# Patient Record
Sex: Male | Born: 1955 | Race: White | Hispanic: No | Marital: Married
Health system: Midwestern US, Community
[De-identification: ages and names within clinical notes are randomized; demographics above are authoritative.]

## PROBLEM LIST (undated history)

## (undated) DIAGNOSIS — R509 Fever, unspecified: Secondary | ICD-10-CM

## (undated) DIAGNOSIS — E119 Type 2 diabetes mellitus without complications: Secondary | ICD-10-CM

## (undated) DIAGNOSIS — R0609 Other forms of dyspnea: Secondary | ICD-10-CM

## (undated) DIAGNOSIS — E785 Hyperlipidemia, unspecified: Secondary | ICD-10-CM

## (undated) DIAGNOSIS — R0600 Dyspnea, unspecified: Secondary | ICD-10-CM

## (undated) DIAGNOSIS — F17200 Nicotine dependence, unspecified, uncomplicated: Secondary | ICD-10-CM

## (undated) DIAGNOSIS — Z8546 Personal history of malignant neoplasm of prostate: Secondary | ICD-10-CM

## (undated) DIAGNOSIS — K5792 Diverticulitis of intestine, part unspecified, without perforation or abscess without bleeding: Secondary | ICD-10-CM

## (undated) DIAGNOSIS — I8390 Asymptomatic varicose veins of unspecified lower extremity: Secondary | ICD-10-CM

## (undated) HISTORY — DX: Nicotine dependence, unspecified, uncomplicated: F17.200

## (undated) HISTORY — DX: Other forms of dyspnea: R06.09

## (undated) HISTORY — DX: Type 2 diabetes mellitus without complications: E11.9

## (undated) HISTORY — DX: Personal history of malignant neoplasm of prostate: Z85.46

## (undated) HISTORY — DX: Diverticulitis of intestine, part unspecified, without perforation or abscess without bleeding: K57.92

## (undated) HISTORY — DX: Dyspnea, unspecified: R06.00

## (undated) HISTORY — DX: Hyperlipidemia, unspecified: E78.5

## (undated) HISTORY — DX: Asymptomatic varicose veins of unspecified lower extremity: I83.90

---

## 1997-04-26 ENCOUNTER — Ambulatory Visit (HOSPITAL_COMMUNITY): Admission: RE | Admit: 1997-04-26 | Discharge: 1997-04-26 | Payer: Self-pay | Admitting: Family Medicine

## 1997-06-24 ENCOUNTER — Ambulatory Visit (HOSPITAL_COMMUNITY): Admission: RE | Admit: 1997-06-24 | Discharge: 1997-06-24 | Payer: Self-pay | Admitting: Gastroenterology

## 1999-03-17 ENCOUNTER — Encounter: Admission: RE | Admit: 1999-03-17 | Discharge: 1999-04-01 | Payer: Self-pay | Admitting: Family Medicine

## 2003-07-12 ENCOUNTER — Ambulatory Visit (HOSPITAL_BASED_OUTPATIENT_CLINIC_OR_DEPARTMENT_OTHER): Admission: RE | Admit: 2003-07-12 | Discharge: 2003-07-12 | Payer: Self-pay | Admitting: Family Medicine

## 2005-05-20 ENCOUNTER — Inpatient Hospital Stay (HOSPITAL_COMMUNITY): Admission: RE | Admit: 2005-05-20 | Discharge: 2005-05-21 | Payer: Self-pay | Admitting: Urology

## 2019-10-25 NOTE — Progress Notes (Signed)
CARDIOLOGY CONSULT NOTE       Patient ID: Ryan Hill MRN: [id number] DOB/AGE: 1956-02-09 64 y.o.  Admit date: (Not on file) Referring Physician: Jacelyn Grip Primary Physician: Vernie Shanks, MD Primary Cardiologist: New Reason for Consultation: Dyspnea  Active Problems:   * No active hospital problems. *   HPI:  64 y.o. referred by Dr Jacelyn Grip for dyspnea  He is smoker with HLD and DM.  Latter poorly controlled with recent A1c 10.8 labs otherwise ok CXR 08/17/19 NAD, no CE.  Has had COVID vaccine ECG normal sinus rate 59 normal ECG at his office and today in ours rate 67 In talking to the patient and his wife he has not taken very good care of himself. DM not controlled Triglycerides over 500 , smoked up until a couple months ago. Currently only on glucophage. Has wine most nights. Long discussion about need to cut back on ETOH, get BS under control and likely start cholesterol medicine. I suspect his general malaise and lack of stamina is related to these medical issues Denies actual chest pain, palpitations or dyspnea. Had vascular studies done recently with no PVD and no AAA  He and his wife live in RV Currently in [place] mostly but son lives here Use to sail quit a bit and met his wife Retrieving an old anchor in [place] after he was stranded   ROS All other systems reviewed and negative except as noted above  Past Medical History:  Diagnosis Date  . Asymptomatic varicose veins   . Diverticulitis   . DM (diabetes mellitus) ([place])   . Exertional dyspnea   . History of prostate cancer   . Hyperlipidemia   . Smoker     Family History  Problem Relation Age of Onset  . Cancer Father   . Cancer Brother   . Cancer Brother     Social History   Socioeconomic History  . Marital status: Married    Spouse name: Not on file  . Number of children: Not on file  . Years of education: Not on file  . Highest education level: Not on file  Occupational History  . Not on file  Tobacco Use   . Smoking status: Former Smoker    Types: Cigars    Quit date: 08/17/2019    Years since quitting: 0.2  Substance and Sexual Activity  . Alcohol use: Not on file  . Drug use: Not on file  . Sexual activity: Not on file  Other Topics Concern  . Not on file  Social History Narrative  . Not on file   Social Determinants of Health   Financial Resource Strain:   . Difficulty of Paying Living Expenses: Not on file  Food Insecurity:   . Worried About Charity fundraiser in the Last Year: Not on file  . Ran Out of Food in the Last Year: Not on file  Transportation Needs:   . Lack of Transportation (Medical): Not on file  . Lack of Transportation (Non-Medical): Not on file  Physical Activity:   . Days of Exercise per Week: Not on file  . Minutes of Exercise per Session: Not on file  Stress:   . Feeling of Stress : Not on file  Social Connections:   . Frequency of Communication with Friends and Family: Not on file  . Frequency of Social Gatherings with Friends and Family: Not on file  . Attends Religious Services: Not on file  . Active Member of Clubs  or Organizations: Not on file  . Attends Archivist Meetings: Not on file  . Marital Status: Not on file  Intimate Partner Violence:   . Fear of Current or Ex-Partner: Not on file  . Emotionally Abused: Not on file  . Physically Abused: Not on file  . Sexually Abused: Not on file      Current Outpatient Medications:  .  metFORMIN (GLUCOPHAGE) 500 MG tablet, Take 500 mg by mouth 2 (two) times daily with a meal., Disp: , Rfl:  .  metoprolol tartrate (LOPRESSOR) 50 MG tablet, Take one tablet by mouth two hours prior to CT, Disp: 1 tablet, Rfl: 0    Physical Exam: Blood pressure 98/62, pulse 67, height $RemoveBe'5\' 10"'RtZzruURB$  (1.778 m), weight 194 lb (88 kg), SpO2 98 %.   Affect appropriate Healthy:  appears stated age 45: normal Neck supple with no adenopathy JVP normal no bruits no thyromegaly Lungs clear with no wheezing and  good diaphragmatic motion Heart:  S1/S2 no murmur, no rub, gallop or click PMI normal Abdomen: benighn, BS positve, no tenderness, no AAA no bruit.  No HSM or HJR Distal pulses intact with no bruits No edema Neuro non-focal Skin warm and dry No muscular weakness     Radiology: No results found.  EKG: Julsy 2021 primary SR rate 59 normal ECG    ASSESSMENT AND PLAN:   1. Dyspnea :  Normal exam and ECG check echo for RV/LV EF 2. DM:  Discussed low carb diet.  Target hemoglobin A1c is 6.5 or less.  Continue current medications. Encouraged him to see eye doctor  3. HLD:  F.u primary und able to calculate LdL due to high triglycerides Suspect he should be on statin and vascepa  Will know more once A1c down , ETOH cut back and we have calcium score from cardiac CT 4. CAD:  See #1 r/o anginal equivalent with risk factors needs testing Favor cardiac CTA discussed and patient willing to proceed. Will have lopressor 50 mg 2 hours before study and BMET prior to exam   Signed: Jenkins Rouge 11/07/2019, 4:34 PM

## 2019-11-07 ENCOUNTER — Ambulatory Visit (INDEPENDENT_AMBULATORY_CARE_PROVIDER_SITE_OTHER): Payer: 59 | Admitting: Cardiovascular Disease

## 2019-11-07 ENCOUNTER — Other Ambulatory Visit: Payer: Self-pay

## 2019-11-07 ENCOUNTER — Encounter: Payer: Self-pay | Admitting: Cardiovascular Disease

## 2019-11-07 VITALS — BP 98/62 | HR 67 | Ht 70.0 in | Wt 194.0 lb

## 2019-11-07 DIAGNOSIS — R0600 Dyspnea, unspecified: Secondary | ICD-10-CM

## 2019-11-07 MED ORDER — METOPROLOL TARTRATE 50 MG PO TABS: ORAL_TABLET | ORAL | 0 refills | Status: DC

## 2019-11-07 NOTE — Patient Instructions (Addendum)
Medication Instructions:  *If you need a refill on your cardiac medications before your next appointment, please call your pharmacy*  Lab Work: Your physician recommends that you return for lab work in: before [place] for [employer].  If you have labs (blood work) drawn today and your tests are completely normal, you will receive your results only by: Marland Kitchen MyChart Message (if you have MyChart) OR . A paper copy in the mail If you have any lab test that is abnormal or we need to change your treatment, we will call you to review the results.  Testing/Procedures: Your physician has requested that you have an echocardiogram. Echocardiography is a painless test that uses sound waves to create images of your heart. It provides your doctor with information about the size and shape of your heart and how well your heart's chambers and valves are working. This procedure takes approximately one hour. There are no restrictions for this procedure.  Your physician has requested that you have cardiac CT. Cardiac computed tomography (CT) is a painless test that uses an x-ray machine to take clear, detailed pictures of your heart. For further information please visit [email or web address]. Please follow instruction sheet as given.  Follow-Up: At [facility], you and your health needs are our priority.  As part of our continuing mission to provide you with exceptional heart care, we have created designated Provider Care Teams.  These Care Teams include your primary Cardiologist (physician) and Advanced Practice Providers (APPs -  Physician Assistants and Nurse Practitioners) who all work together to provide you with the care you need, when you need it.  We recommend signing up for the patient portal called "MyChart".  Sign up information is provided on this After Visit Summary.  MyChart is used to connect with patients for Virtual Visits (Telemedicine).  Patients are able to view lab/test results, encounter notes, upcoming  appointments, etc.  Non-urgent messages can be sent to your provider as well.   To learn more about what you can do with MyChart, go to [email or web address].    Your next appointment:   6 month(s)  The format for your next appointment:   In Person  Provider:   You may see Dr. Johnsie Cancel or one of the following Advanced Practice Providers on your designated Care Team:    Truitt Merle, NP  Cecilie Kicks, NP  Kathyrn Drown, NP    Your cardiac CT will be scheduled at one of the below locations:   [facility] [address] [place], [place] [address] [phone]  If scheduled at [facility], please arrive at the [facility] main entrance of [facility] 30 minutes prior to test start time. Proceed to the [facility] Radiology Department (first floor) to check-in and test prep.  Please follow these instructions carefully (unless otherwise directed):  Hold all erectile dysfunction medications at least 3 days (72 hrs) prior to test.  On the Night Before the Test: . Be sure to Drink plenty of water. . Do not consume any caffeinated/decaffeinated beverages or chocolate 12 hours prior to your test. . Do not take any antihistamines 12 hours prior to your test.  On the Day of the Test: . Drink plenty of water. Do not drink any water within one hour of the test. . Do not eat any food 4 hours prior to the test. . You may take your regular medications prior to the test.  . Take metoprolol (Lopressor) 50 mg two hours prior to test.  After the Test: . Drink plenty of water. . After receiving IV contrast, you may experience a mild flushed feeling. This is normal. . On occasion, you may experience a mild rash up to 24 hours after the test. This is not dangerous. If this occurs, you can take Benadryl 25 mg and increase your fluid intake. . If you experience trouble breathing, this can be serious. If it is severe call 911 IMMEDIATELY. If it is mild, please  call our office. . If you take any of these medications: Metformin  please do not take 48 hours after completing test unless otherwise instructed.   Once we have confirmed authorization from your insurance company, we will call you to set up a date and time for your test. Based on how quickly your insurance processes prior authorizations requests, please allow up to 4 weeks to be contacted for scheduling your Cardiac CT appointment. Be advised that routine Cardiac CT appointments could be scheduled as many as 8 weeks after your provider has ordered it.  For non-scheduling related questions, please contact the cardiac imaging nurse navigator should you have any questions/concerns: Marchia Bond, Cardiac Imaging Nurse Navigator Burley Saver, Interim Cardiac Imaging Nurse [place] [facility] Direct Office Dial: [phone]   For scheduling needs, including cancellations and rescheduling, please call Vivien Rota at [phone], option 3.

## 2019-11-08 ENCOUNTER — Other Ambulatory Visit: Payer: Self-pay | Admitting: Cardiovascular Disease

## 2019-11-15 ENCOUNTER — Other Ambulatory Visit: Payer: Self-pay

## 2019-11-15 ENCOUNTER — Ambulatory Visit (HOSPITAL_COMMUNITY): Payer: 59 | Attending: Internal Medicine

## 2019-11-15 DIAGNOSIS — R0600 Dyspnea, unspecified: Secondary | ICD-10-CM

## 2019-11-15 LAB — ECHOCARDIOGRAM COMPLETE
Area-P 1/2: 3.39 cm2
S' Lateral: 3.7 cm

## 2019-11-30 ENCOUNTER — Telehealth: Payer: Self-pay

## 2019-11-30 DIAGNOSIS — R0600 Dyspnea, unspecified: Secondary | ICD-10-CM

## 2019-11-30 NOTE — Telephone Encounter (Signed)
Left message for patient to call back.  Insurance will not cover cardiac CT. Per Dr. Eden Emms can order exercise myoview.

## 2019-12-12 ENCOUNTER — Telehealth (HOSPITAL_COMMUNITY): Payer: Self-pay | Admitting: *Deleted

## 2019-12-12 NOTE — Telephone Encounter (Signed)
Patient given detailed instructions per Myocardial Perfusion Study Information Sheet for the test on 12/19/19. Patient notified to arrive 15 minutes early and that it is imperative to arrive on time for appointment to keep from having the test rescheduled.  If you need to cancel or reschedule your appointment, please call the office within 24 hours of your appointment. . Patient verbalized understanding. Ryan Hill    

## 2019-12-15 ENCOUNTER — Other Ambulatory Visit (HOSPITAL_COMMUNITY)
Admission: RE | Admit: 2019-12-15 | Discharge: 2019-12-15 | Disposition: A | Discharge status: Home / Self Care | Payer: 59 | Source: Ambulatory Visit | Attending: Cardiovascular Disease | Admitting: Cardiovascular Disease

## 2019-12-15 DIAGNOSIS — Z01812 Encounter for preprocedural laboratory examination: Secondary | ICD-10-CM | POA: Insufficient documentation

## 2019-12-15 DIAGNOSIS — Z20822 Contact with and (suspected) exposure to covid-19: Secondary | ICD-10-CM | POA: Insufficient documentation

## 2019-12-15 LAB — SARS CORONAVIRUS 2 (TAT 6-24 HRS): SARS Coronavirus 2: NEGATIVE

## 2019-12-19 ENCOUNTER — Other Ambulatory Visit: Payer: Self-pay

## 2019-12-19 ENCOUNTER — Ambulatory Visit (HOSPITAL_COMMUNITY): Payer: 59 | Attending: Internal Medicine

## 2019-12-19 ENCOUNTER — Telehealth: Payer: Self-pay

## 2019-12-19 DIAGNOSIS — R9439 Abnormal result of other cardiovascular function study: Secondary | ICD-10-CM

## 2019-12-19 DIAGNOSIS — R0600 Dyspnea, unspecified: Secondary | ICD-10-CM | POA: Insufficient documentation

## 2019-12-19 DIAGNOSIS — R072 Precordial pain: Secondary | ICD-10-CM

## 2019-12-19 LAB — MYOCARDIAL PERFUSION IMAGING
Estimated workload: 10.1 METS
Exercise duration (min): 9 min
Exercise duration (sec): 1 s
LV dias vol: 112 mL (ref 62–150)
LV sys vol: 61 mL
MPHR: 156 {beats}/min
Peak HR: 136 {beats}/min
Percent HR: 87 %
Rest HR: 63 {beats}/min
SDS: 2
SRS: 0
SSS: 2
TID: 0.99

## 2019-12-19 MED ORDER — TECHNETIUM TC 99M TETROFOSMIN IV KIT
10.7000 | PACK | Freq: Once | INTRAVENOUS | Status: AC | PRN
  Administered 2019-12-19: 10.7 via INTRAVENOUS
  Filled 2019-12-19: qty 11

## 2019-12-19 MED ORDER — TECHNETIUM TC 99M TETROFOSMIN IV KIT
30.4000 | PACK | Freq: Once | INTRAVENOUS | Status: AC | PRN
  Administered 2019-12-19: 30.4 via INTRAVENOUS
  Filled 2019-12-19: qty 31

## 2019-12-19 NOTE — Telephone Encounter (Signed)
Ordered cardiac CT. Patient aware of results. Will send message to mychart of instructions.

## 2019-12-19 NOTE — Telephone Encounter (Signed)
-----   Message from Wendall Stade, MD sent at 12/19/2019  2:45 PM EST ----- Some but minor abnormalities on myovue ECG positive perfusion probably ok but inferior attenuation ? Diaphragm. EF reported as 46% but normal by echo See if we can get cardiac CT covered now for abnormal myovue. If not may need cath can schedule f/u with me .

## 2020-01-16 ENCOUNTER — Other Ambulatory Visit: Payer: Self-pay

## 2020-01-16 ENCOUNTER — Other Ambulatory Visit: Payer: 59 | Admitting: *Deleted

## 2020-01-16 DIAGNOSIS — R072 Precordial pain: Secondary | ICD-10-CM

## 2020-01-16 DIAGNOSIS — R9439 Abnormal result of other cardiovascular function study: Secondary | ICD-10-CM

## 2020-01-16 NOTE — Addendum Note (Signed)
Addended by: Virl Axe, Sadiya Durand L on: 01/16/2020 12:58 PM   Modules accepted: Orders

## 2020-01-17 LAB — BASIC METABOLIC PANEL
BUN/Creatinine Ratio: 20 (ref 10–24)
BUN: 15 mg/dL (ref 8–27)
CO2: 26 mmol/L (ref 20–29)
Calcium: 9.2 mg/dL (ref 8.6–10.2)
Chloride: 100 mmol/L (ref 96–106)
Creatinine, Ser: 0.74 mg/dL — ABNORMAL LOW (ref 0.76–1.27)
GFR calc Af Amer: 113 mL/min/{1.73_m2} (ref >59)
GFR calc non Af Amer: 97 mL/min/{1.73_m2} (ref >59)
Glucose: 195 mg/dL — ABNORMAL HIGH (ref 65–99)
Potassium: 4.6 mmol/L (ref 3.5–5.2)
Sodium: 140 mmol/L (ref 134–144)

## 2020-01-28 ENCOUNTER — Telehealth (HOSPITAL_COMMUNITY): Payer: Self-pay | Admitting: Emergency Medicine

## 2020-01-28 NOTE — Telephone Encounter (Signed)
Attempted to call patient regarding upcoming cardiac CT appointment. °Left message on voicemail with name and callback number °Zehra Rucci RN Navigator Cardiac Imaging °[facility] °[phone] Office °[phone] Cell ° °

## 2020-01-30 ENCOUNTER — Encounter (HOSPITAL_COMMUNITY): Payer: Self-pay

## 2020-01-30 ENCOUNTER — Ambulatory Visit (HOSPITAL_COMMUNITY)
Admission: RE | Admit: 2020-01-30 | Discharge: 2020-01-30 | Disposition: A | Discharge status: Home / Self Care | Payer: 59 | Source: Ambulatory Visit | Attending: Cardiovascular Disease | Admitting: Cardiovascular Disease

## 2020-01-30 ENCOUNTER — Other Ambulatory Visit: Payer: Self-pay

## 2020-01-30 DIAGNOSIS — R9439 Abnormal result of other cardiovascular function study: Secondary | ICD-10-CM | POA: Insufficient documentation

## 2020-01-30 DIAGNOSIS — R072 Precordial pain: Secondary | ICD-10-CM | POA: Diagnosis present

## 2020-01-30 DIAGNOSIS — I2510 Atherosclerotic heart disease of native coronary artery without angina pectoris: Secondary | ICD-10-CM | POA: Diagnosis not present

## 2020-01-30 DIAGNOSIS — Z006 Encounter for examination for normal comparison and control in clinical research program: Secondary | ICD-10-CM

## 2020-01-30 IMAGING — CT CT HEART MORP W/ CTA COR W/ SCORE W/ CA W/CM &/OR W/O CM
4 of 7 series · 8 of 20 positions shown, 9 images · non-contrast
Comparison: 05/17/2005 chest radiograph. 08/17/2019 chest
radiograph from [facility].
COMPARISON: 05/17/2005 chest radiograph. 08/17/2019 chest
radiograph from [facility].

Addendum:
EXAM:
OVER-READ INTERPRETATION  CT CHEST

The following report is an over-read performed by radiologist Dr.
over-read does not include interpretation of cardiac or coronary
anatomy or pathology. The coronary CTA interpretation by the
cardiologist is attached.
CLINICAL DATA: Chest pain
Cardiac CTA
MEDICATIONS:
Sub lingual nitro. 4 mg and lopressor 50mg
TECHNIQUE: The patient was scanned on a Siemens Force 192 scanner. Gantry
rotation speed was 250 msecs. Collimation was. 6 mm . A 120 kV
prospective scan was triggered in the ascending thoracic aorta at
140 HU's with full mA between 30-70% of the R-R interval . Average
HR during the scan was 54 bpm. The 3D data set was interpreted on a
dedicated work station using MPR, MIP and VRT modes. A total of 80
cc of contrast was used.

[Series 6: best diast 75 % · axial · 0.35mm/px · z∈[+1146,+1189]mm · 2 of 326 slices shown]
[im 109/326  vessel]
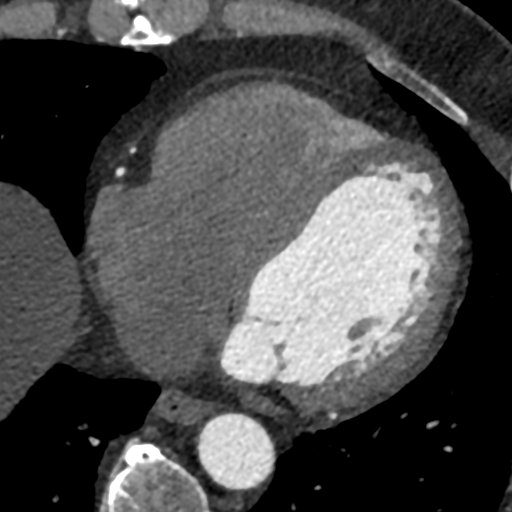
[im 217/326  vessel]
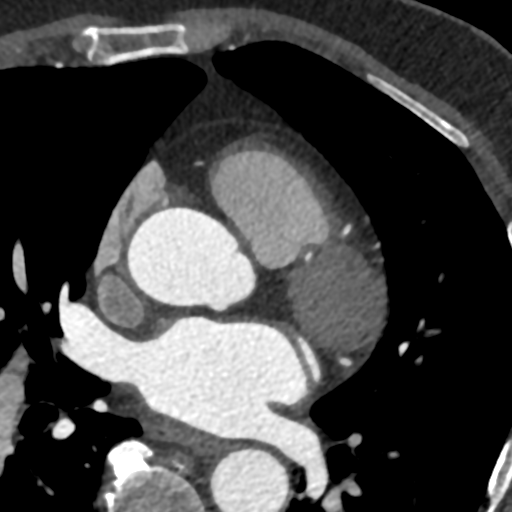

[Series 7: best syst · axial · 0.35mm/px · z∈[+1146,+1189]mm · 2 of 326 slices shown, 3 images]
[im 109/326  vessel]
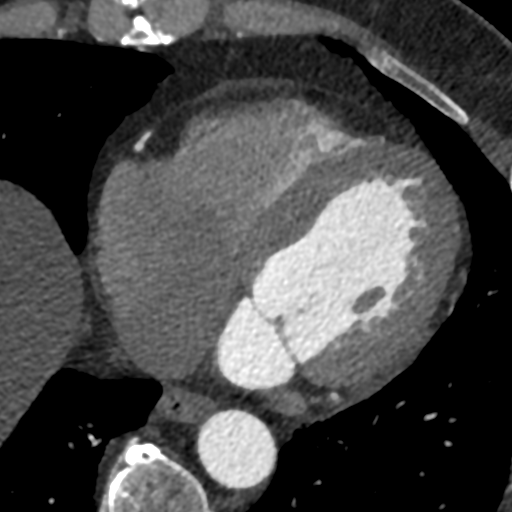
[im 109/326  lung]
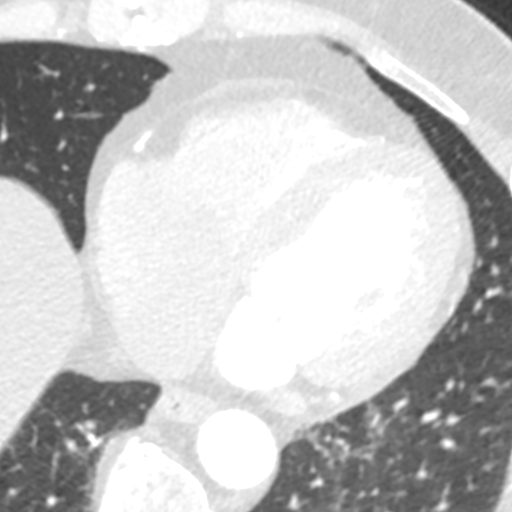
[im 217/326  vessel]
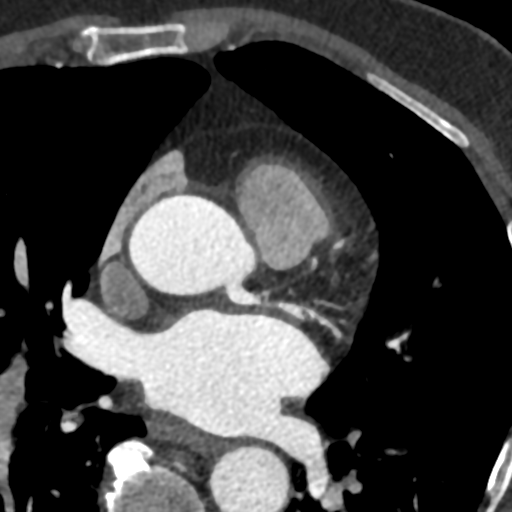

[Series 8: ts diast sharp 75 % · axial · 0.35mm/px · z∈[+1146,+1189]mm · 2 of 326 slices shown]
[im 109/326  lung]
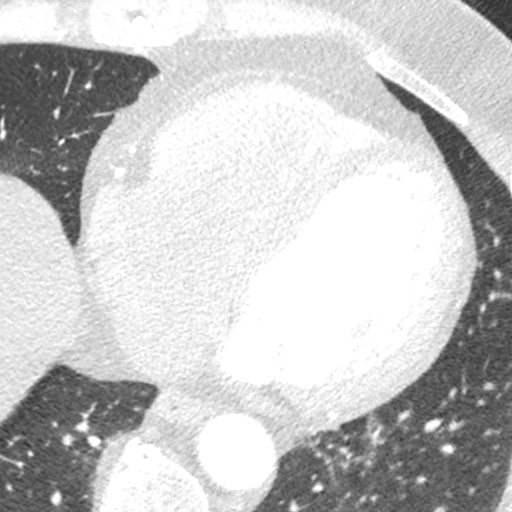
[im 217/326  lung]
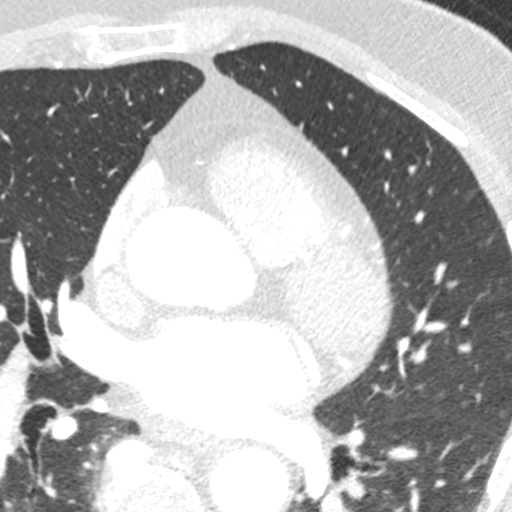

[Series 9: ts syst sharp · axial · 0.35mm/px · z∈[+1146,+1189]mm · 2 of 326 slices shown]
[im 109/326  lung]
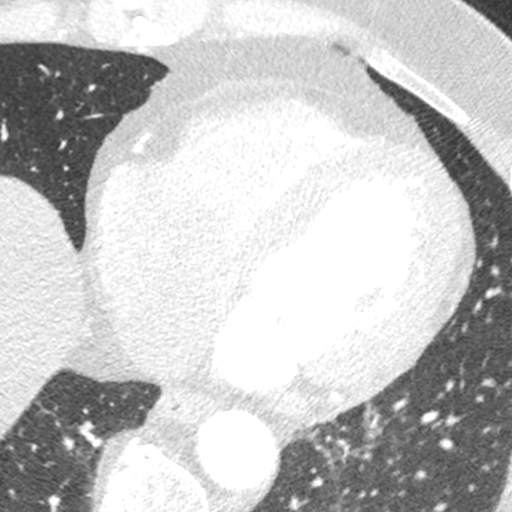
[im 217/326  lung]
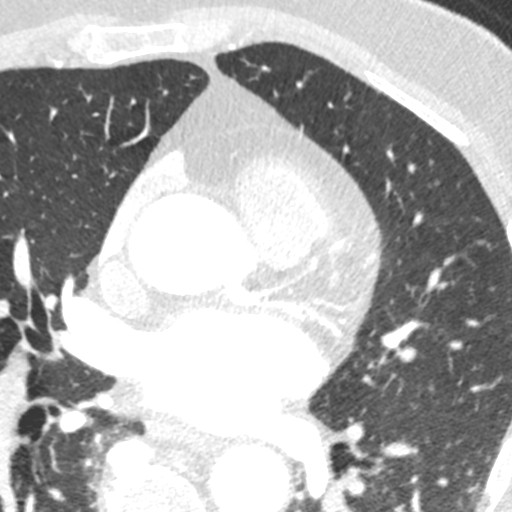

[8 of 20 positions shown; findings below may reference images not displayed]

FINDINGS: Vascular: Aortic atherosclerosis. No central pulmonary embolism, on
this non-dedicated study.

Mediastinum/Nodes: No imaged thoracic adenopathy.

Lungs/Pleura: No pleural fluid.  Clear imaged lungs.

Upper Abdomen: Hepatic steatosis. Normal imaged portions of the
spleen, stomach.

Musculoskeletal: Moderate midthoracic spondylosis.
IMPRESSION: 1. No acute findings in the imaged extracardiac chest.
2. Hepatic steatosis.
3. Aortic Atherosclerosis (4T26T-AQ7.7).
FINDINGS: Non-cardiac: See separate report from [REDACTED]. No
significant findings on limited lung and soft tissue windows.

Calcium score: Calcium noted in LAD and circumflex

Coronary Arteries: Right dominant with no anomalies

LM: Normal

LAD: 50% calcified ostial LAD lesion The mid LAD at the D1 take off
is poorly filled with possibly > 70% stenosis Part of the distal LAD
may be intra-myocardial

And is also diminutive

D1: 25-49% calcific plaque in mid body

D2: Normal

Circumflex:

OM1: 1-24% calcified plaque proximally

OM2: 1-24% calcified plaque in ostium

RCA: Normal

PDA: Normal

PLA: Normal
IMPRESSION: 1.  Calcium score 182 which is 68 th percentile for age and sex

2. CAD RADS 3 possibly obstructive disease in the mid LAD wit 50%
calcified ostial LAD disease Study sent for FFR CT

3.   Normal aortic root 3.3 cm

Grandpaa Demoya

*** End of Addendum ***
EXAM:
OVER-READ INTERPRETATION  CT CHEST

The following report is an over-read performed by radiologist Dr.
over-read does not include interpretation of cardiac or coronary
anatomy or pathology. The coronary CTA interpretation by the
cardiologist is attached.
FINDINGS: Vascular: Aortic atherosclerosis. No central pulmonary embolism, on
this non-dedicated study.

Mediastinum/Nodes: No imaged thoracic adenopathy.

Lungs/Pleura: No pleural fluid.  Clear imaged lungs.

Upper Abdomen: Hepatic steatosis. Normal imaged portions of the
spleen, stomach.

Musculoskeletal: Moderate midthoracic spondylosis.
IMPRESSION: 1. No acute findings in the imaged extracardiac chest.
2. Hepatic steatosis.
3. Aortic Atherosclerosis (4T26T-AQ7.7).

## 2020-01-30 MED ORDER — IOHEXOL 350 MG/ML SOLN
80.0000 mL | Freq: Once | INTRAVENOUS | Status: AC | PRN
  Administered 2020-01-30: 80 mL via INTRAVENOUS

## 2020-01-30 MED ORDER — NITROGLYCERIN 0.4 MG SL SUBL
SUBLINGUAL_TABLET | SUBLINGUAL | Status: AC
  Administered 2020-01-30: 0.8 mg via SUBLINGUAL
  Filled 2020-01-30: qty 2

## 2020-01-30 MED ORDER — NITROGLYCERIN 0.4 MG SL SUBL: 0.8000 mg | SUBLINGUAL_TABLET | SUBLINGUAL | Status: DC | PRN

## 2020-01-30 NOTE — Research (Signed)
IDENTIFY Informed Consent   Subject Name: Ryan Hill  Subject met inclusion and exclusion criteria.  The informed consent form, study requirements and expectations were reviewed with the subject and questions and concerns were addressed prior to the signing of the consent form.  The subject verbalized understanding of the trial requirements.  The subject agreed to participate in the IDENTIFY trial and signed the informed consent at 1417 on 22/DEC/2021.  The informed consent was obtained prior to performance of any protocol-specific procedures for the subject.  A copy of the signed informed consent was given to the subject and a copy was placed in the subject's medical record.   Sherlyn Lees

## 2020-01-31 ENCOUNTER — Ambulatory Visit (INDEPENDENT_AMBULATORY_CARE_PROVIDER_SITE_OTHER): Payer: 59 | Admitting: Cardiovascular Disease

## 2020-01-31 ENCOUNTER — Other Ambulatory Visit: Payer: Self-pay | Admitting: Cardiovascular Disease

## 2020-01-31 ENCOUNTER — Encounter: Payer: Self-pay | Admitting: Cardiovascular Disease

## 2020-01-31 VITALS — BP 110/64 | HR 63 | Ht 70.0 in | Wt 195.2 lb

## 2020-01-31 DIAGNOSIS — E782 Mixed hyperlipidemia: Secondary | ICD-10-CM | POA: Diagnosis not present

## 2020-01-31 DIAGNOSIS — I2510 Atherosclerotic heart disease of native coronary artery without angina pectoris: Secondary | ICD-10-CM

## 2020-01-31 MED ORDER — SODIUM CHLORIDE 0.9% FLUSH: 3.0000 mL | Freq: Two times a day (BID) | INTRAVENOUS | Status: DC

## 2020-01-31 NOTE — Patient Instructions (Addendum)
Medication Instructions:  *If you need a refill on your cardiac medications before your next appointment, please call your pharmacy*   Lab Work: Your physician recommends that you have lab work today- BMET and CBC.  If you have labs (blood work) drawn today and your tests are completely normal, you will receive your results only by: Marland Kitchen MyChart Message (if you have MyChart) OR . A paper copy in the mail If you have any lab test that is abnormal or we need to change your treatment, we will call you to review the results.   Testing/Procedures: Your physician has requested that you have a cardiac catheterization. Cardiac catheterization is used to diagnose and/or treat various heart conditions. Doctors may recommend this procedure for a number of different reasons. The most common reason is to evaluate chest pain. Chest pain can be a symptom of coronary artery disease (CAD), and cardiac catheterization can show whether plaque is narrowing or blocking your heart's arteries. This procedure is also used to evaluate the valves, as well as measure the blood flow and oxygen levels in different parts of your heart. For further information please visit [email or web address] Please follow instruction sheet, as given.  Follow-Up: At [facility], you and your health needs are our priority.  As part of our continuing mission to provide you with exceptional heart care, we have created designated Provider Care Teams.  These Care Teams include your primary Cardiologist (physician) and Advanced Practice Providers (APPs -  Physician Assistants and Nurse Practitioners) who all work together to provide you with the care you need, when you need it.  We recommend signing up for the patient portal called "MyChart".  Sign up information is provided on this After Visit Summary.  MyChart is used to connect with patients for Virtual Visits (Telemedicine).  Patients are able to view lab/test results, encounter notes, upcoming  appointments, etc.  Non-urgent messages can be sent to your provider as well.   To learn more about what you can do with MyChart, go to [email or web address].    Your next appointment:   2 week(s)  The format for your next appointment:   In Person  Provider:   You may see Dr. Eden Emms or one of the following Advanced Practice Providers on your designated Care Team:    Norma Fredrickson, NP  Nada Boozer, NP  Georgie Chard, NP      [facility] CARDIOVASCULAR DIVISION [facility] ST OFFICE [address] [place] [address] Dept: [phone] Loc: [phone]  NIV DARLEY  01/31/2020  You are scheduled for a Cardiac Catheterization on Thursday, December 30 with Dr. Verdis Prime.  1. Please arrive at the [facility] (Main Entrance A) at [facility]: [address] [place], [place] [address] at 5:30 AM (This time is two hours before your procedure to ensure your preparation). Free valet parking service is available.   Special note: Every effort is made to have your procedure done on time. Please understand that emergencies sometimes delay scheduled procedures.  2. Diet: Do not eat solid foods after midnight.  The patient may have clear liquids until 5am upon the day of the procedure.  3. Labs: You will need to have blood drawn on Thursday, December 23 at [facility] at [facility]. [address], [place]  Open: 7:30am - 5pm    Phone: [phone]. You do not need to be fasting.  Due to recent COVID-19 restrictions implemented by  our local and state authorities and in an effort to keep both patients and staff as safe as possible, our hospital system requires COVID-19 testing prior to certain scheduled hospital procedures.  Please go to [phone] [facility] [place], [place] [address] on 02/05/20 at 3:10 pm  .  This is a drive up testing site.  You will not need to exit your vehicle.  You will not be billed  at the time of testing but may receive a bill later depending on your insurance. You must agree to self-quarantine from the time of your testing until the procedure date on 02/07/20.  This should included staying home with ONLY the people you live with.  Avoid take-out, grocery store shopping or leaving the house for any non-emergent reason.  Failure to have your COVID-19 test done on the date and time you have been scheduled will result in cancellation of your procedure.  Please call our office at [phone] if you have any questions.   4. Medication instructions in preparation for your procedure:   Contrast Allergy: No  Do not take Diabetes Med Glucophage (Metformin) on the day of the procedure and HOLD 48 HOURS AFTER THE PROCEDURE.  On the morning of your procedure, take your Aspirin and any morning medicines NOT listed above.  You may use sips of water.  5. Plan for one night stay--bring personal belongings. 6. Bring a current list of your medications and current insurance cards. 7. You MUST have a responsible person to drive you home. 8. Someone MUST be with you the first 24 hours after you arrive home or your discharge will be delayed. 9. Please wear clothes that are easy to get on and off and wear slip-on shoes.  Thank you for allowing [place] to care for you!   -- [facility]

## 2020-01-31 NOTE — Progress Notes (Signed)
CARDIOLOGY CONSULT NOTE       Patient ID: Ryan Hill MRN: [id number] DOB/AGE: 05/16/1955 64 y.o.  Admit date: (Not on file) Referring Physician: Jacelyn Grip Primary Physician: Vernie Shanks, MD Primary Cardiologist: New Reason for Consultation: Dyspnea  Active Problems:   * No active hospital problems. *   HPI:  64 y.o. first seen 11/07/19   Referred for dyspnea and fatigue  He is smoker with HLD and DM.  Latter poorly controlled with recent A1c 10.8 labs otherwise ok CXR 08/17/19 NAD, no CE.  Has had COVID vaccine ECG normal sinus rate 59 normal ECG  DM not controlled Triglycerides over 500 , smoked up until a couple months ago. Currently only on glucophage. Has wine most nights. Long discussion about need to cut back on ETOH, get BS under control and likely start cholesterol medicine. I suspect his general malaise and lack of stamina is related to these medical issues Denies actual chest pain, palpitations or dyspnea. Had vascular studies done recently with no PVD and no AAA  He and his wife live in RV Currently in [place] mostly but son lives here Use to sail quit a bit and met his wife Retrieving an old anchor in [place] after he was stranded   Cardiac CTA done 03/02/19 reviewed Calcium score 182 which is 30 th percentile 50% ostial LAD with >70% lesion in mid vessel after D1 FFR CT positive in mid LAD at 0.53  Showed patient images Discussed diagnosis of CAD. Discussed need for diagnostic cath next week including Stroke , bleeding, contrast reaction need for emergency surgery Willing to proceed Will continue beta blocker ASA hold glucophage night before study    ROS All other systems reviewed and negative except as noted above  Past Medical History:  Diagnosis Date  . Asymptomatic varicose veins   . Diverticulitis   . DM (diabetes mellitus) ([place])   . Exertional dyspnea   . History of prostate cancer   . Hyperlipidemia   . Smoker     Family History  Problem Relation Age  of Onset  . Cancer Father   . Cancer Brother   . Cancer Brother     Social History   Socioeconomic History  . Marital status: Married    Spouse name: Not on file  . Number of children: Not on file  . Years of education: Not on file  . Highest education level: Not on file  Occupational History  . Not on file  Tobacco Use  . Smoking status: Former Smoker    Types: Cigars    Quit date: 08/17/2019    Years since quitting: 0.4  . Smokeless tobacco: Not on file  Substance and Sexual Activity  . Alcohol use: Not on file  . Drug use: Not on file  . Sexual activity: Not on file  Other Topics Concern  . Not on file  Social History Narrative  . Not on file   Social Determinants of Health   Financial Resource Strain: Not on file  Food Insecurity: Not on file  Transportation Needs: Not on file  Physical Activity: Not on file  Stress: Not on file  Social Connections: Not on file  Intimate Partner Violence: Not on file      Current Outpatient Medications:  .  metFORMIN (GLUCOPHAGE) 500 MG tablet, Take 500 mg by mouth 2 (two) times daily with a meal., Disp: , Rfl:  .  metoprolol tartrate (LOPRESSOR) 50 MG tablet, Take one tablet by mouth two  hours prior to CT, Disp: 1 tablet, Rfl: 0    Physical Exam: There were no vitals taken for this visit.   Affect appropriate Healthy:  appears stated age 64: normal Neck supple with no adenopathy JVP normal no bruits no thyromegaly Lungs clear with no wheezing and good diaphragmatic motion Heart:  S1/S2 no murmur, no rub, gallop or click PMI normal Abdomen: benighn, BS positve, no tenderness, no AAA no bruit.  No HSM or HJR Distal pulses intact with no bruits No edema Neuro non-focal Skin warm and dry No muscular weakness     Radiology: CT CORONARY MORPH W/CTA COR W/SCORE W/CA W/CM &/OR WO/CM  Addendum Date: 01/30/2020   ADDENDUM REPORT: 01/30/2020 16:43 CLINICAL DATA:  Chest pain EXAM: Cardiac CTA MEDICATIONS: Sub lingual  nitro. 4 mg and lopressor $RemoveBefor'50mg'[id number]$  TECHNIQUE: The patient was scanned on a [employer] 192 scanner. Gantry rotation speed was 250 msecs. Collimation was. 6 mm . A 120 kV prospective scan was triggered in the ascending thoracic aorta at 140 HU's with full mA between 30-70% of the R-R interval . Average HR during the scan was 54 bpm. The 3D data set was interpreted on a dedicated work station using MPR, MIP and VRT modes. A total of 80 cc of contrast was used. FINDINGS: Non-cardiac: See separate report from [facility]. No significant findings on limited lung and soft tissue windows. Calcium score: Calcium noted in LAD and circumflex Coronary Arteries: Right dominant with no anomalies LM: Normal LAD: 50% calcified ostial LAD lesion The mid LAD at the D1 take off is poorly filled with possibly > 70% stenosis Part of the distal LAD may be intra-myocardial And is also diminutive D1: 25-49% calcific plaque in mid body D2: Normal Circumflex: OM1: 1-24% calcified plaque proximally OM2: 1-24% calcified plaque in ostium RCA: Normal PDA: Normal PLA: Normal IMPRESSION: 1.  Calcium score 182 which is 86 th percentile for age and sex 2. CAD RADS 3 possibly obstructive disease in the mid LAD wit 50% calcified ostial LAD disease Study sent for FFR CT 3.   Normal aortic root 3.3 cm Jenkins Rouge Electronically Signed   By: Jenkins Rouge M.D.   On: 01/30/2020 16:43   Result Date: 01/30/2020 EXAM: OVER-READ INTERPRETATION  CT CHEST The following report is an over-read performed by radiologist Dr. Abigail Miyamoto of [facility], [place] on 01/30/2020. This over-read does not include interpretation of cardiac or coronary anatomy or pathology. The coronary CTA interpretation by the cardiologist is attached. COMPARISON:  05/17/2005 chest radiograph. 08/17/2019 chest radiograph from [facility] medicine. FINDINGS: Vascular: Aortic atherosclerosis. No central pulmonary embolism, on this non-dedicated study. Mediastinum/Nodes: No imaged  thoracic adenopathy. Lungs/Pleura: No pleural fluid.  Clear imaged lungs. Upper Abdomen: Hepatic steatosis. Normal imaged portions of the spleen, stomach. Musculoskeletal: Moderate midthoracic spondylosis. IMPRESSION: 1. No acute findings in the imaged extracardiac chest. 2. Hepatic steatosis. 3. Aortic Atherosclerosis (ICD10-I70.0). Electronically Signed: By: Abigail Miyamoto M.D. On: 01/30/2020 16:12   CT CORONARY FRACTIONAL FLOW RESERVE DATA PREP  Result Date: 01/31/2020 CLINICAL DATA:  CAD EXAM: FFR CT TECHNIQUE: The best systolic and diastolic phases of the patients gated cardiac CTA sent to HeartFlow for hemodynamic analysis FINDINGS: FFR CT positive in mid LAD 0.53 just after take off of D1 Normal in RCA and circumflex IMPRESSION: Positive FFR CT suggesting hemodynamically significant lesion in mid LAD Heart catheterization will be arranged Electronically Signed   By: Jenkins Rouge M.D.   On: 01/31/2020 08:44    EKG:  Julsy 2021 primary SR rate 59 normal ECG    ASSESSMENT AND PLAN:   1. Dyspnea :  Normal exam and ECG TTE with normal RV/LV EF no bad valves see below ? Anginal equivalent 2. DM:  Discussed low carb diet.  Target hemoglobin A1c is 6.5 or less.  Continue current medications. Encouraged him to see eye doctor  3. HLD:  F.u primary und able to calculate LdL due to high triglycerides Suspect he should be on statin and vascepa  Will know more once A1c down , ETOH cut back and we have calcium score from cardiac CT 4. CAD:  With positive FFR CT significant LAD disease cath arranged for next week continue ASA/beta blocker SL nitro given   Lab called  Orders written F/U post cath   Signed: Jenkins Rouge 01/31/2020, 11:45 AM

## 2020-01-31 NOTE — H&P (View-Only) (Signed)
CARDIOLOGY CONSULT NOTE       Patient ID: Ryan Hill MRN: [id number] DOB/AGE: 05/16/1955 63 y.o.  Admit date: (Not on file) Referring Physician: Jacelyn Grip Primary Physician: Vernie Shanks, MD Primary Cardiologist: New Reason for Consultation: Dyspnea  Active Problems:   * No active hospital problems. *   HPI:  64 y.o. first seen 11/07/19   Referred for dyspnea and fatigue  He is smoker with HLD and DM.  Latter poorly controlled with recent A1c 10.8 labs otherwise ok CXR 08/17/19 NAD, no CE.  Has had COVID vaccine ECG normal sinus rate 59 normal ECG  DM not controlled Triglycerides over 500 , smoked up until a couple months ago. Currently only on glucophage. Has wine most nights. Long discussion about need to cut back on ETOH, get BS under control and likely start cholesterol medicine. I suspect his general malaise and lack of stamina is related to these medical issues Denies actual chest pain, palpitations or dyspnea. Had vascular studies done recently with no PVD and no AAA  He and his wife live in RV Currently in [place] mostly but son lives here Use to sail quit a bit and met his wife Retrieving an old anchor in [place] after he was stranded   Cardiac CTA done 03/02/19 reviewed Calcium score 182 which is 30 th percentile 50% ostial LAD with >70% lesion in mid vessel after D1 FFR CT positive in mid LAD at 0.53  Showed patient images Discussed diagnosis of CAD. Discussed need for diagnostic cath next week including Stroke , bleeding, contrast reaction need for emergency surgery Willing to proceed Will continue beta blocker ASA hold glucophage night before study    ROS All other systems reviewed and negative except as noted above  Past Medical History:  Diagnosis Date  . Asymptomatic varicose veins   . Diverticulitis   . DM (diabetes mellitus) ([place])   . Exertional dyspnea   . History of prostate cancer   . Hyperlipidemia   . Smoker     Family History  Problem Relation Age  of Onset  . Cancer Father   . Cancer Brother   . Cancer Brother     Social History   Socioeconomic History  . Marital status: Married    Spouse name: Not on file  . Number of children: Not on file  . Years of education: Not on file  . Highest education level: Not on file  Occupational History  . Not on file  Tobacco Use  . Smoking status: Former Smoker    Types: Cigars    Quit date: 08/17/2019    Years since quitting: 0.4  . Smokeless tobacco: Not on file  Substance and Sexual Activity  . Alcohol use: Not on file  . Drug use: Not on file  . Sexual activity: Not on file  Other Topics Concern  . Not on file  Social History Narrative  . Not on file   Social Determinants of Health   Financial Resource Strain: Not on file  Food Insecurity: Not on file  Transportation Needs: Not on file  Physical Activity: Not on file  Stress: Not on file  Social Connections: Not on file  Intimate Partner Violence: Not on file      Current Outpatient Medications:  .  metFORMIN (GLUCOPHAGE) 500 MG tablet, Take 500 mg by mouth 2 (two) times daily with a meal., Disp: , Rfl:  .  metoprolol tartrate (LOPRESSOR) 50 MG tablet, Take one tablet by mouth two  hours prior to CT, Disp: 1 tablet, Rfl: 0    Physical Exam: There were no vitals taken for this visit.   Affect appropriate Healthy:  appears stated age 77: normal Neck supple with no adenopathy JVP normal no bruits no thyromegaly Lungs clear with no wheezing and good diaphragmatic motion Heart:  S1/S2 no murmur, no rub, gallop or click PMI normal Abdomen: benighn, BS positve, no tenderness, no AAA no bruit.  No HSM or HJR Distal pulses intact with no bruits No edema Neuro non-focal Skin warm and dry No muscular weakness     Radiology: CT CORONARY MORPH W/CTA COR W/SCORE W/CA W/CM &/OR WO/CM  Addendum Date: 01/30/2020   ADDENDUM REPORT: 01/30/2020 16:43 CLINICAL DATA:  Chest pain EXAM: Cardiac CTA MEDICATIONS: Sub lingual  nitro. 4 mg and lopressor $RemoveBefor'50mg'[id number]$  TECHNIQUE: The patient was scanned on a [employer] 192 scanner. Gantry rotation speed was 250 msecs. Collimation was. 6 mm . A 120 kV prospective scan was triggered in the ascending thoracic aorta at 140 HU's with full mA between 30-70% of the R-R interval . Average HR during the scan was 54 bpm. The 3D data set was interpreted on a dedicated work station using MPR, MIP and VRT modes. A total of 80 cc of contrast was used. FINDINGS: Non-cardiac: See separate report from [facility]. No significant findings on limited lung and soft tissue windows. Calcium score: Calcium noted in LAD and circumflex Coronary Arteries: Right dominant with no anomalies LM: Normal LAD: 50% calcified ostial LAD lesion The mid LAD at the D1 take off is poorly filled with possibly > 70% stenosis Part of the distal LAD may be intra-myocardial And is also diminutive D1: 25-49% calcific plaque in mid body D2: Normal Circumflex: OM1: 1-24% calcified plaque proximally OM2: 1-24% calcified plaque in ostium RCA: Normal PDA: Normal PLA: Normal IMPRESSION: 1.  Calcium score 182 which is 38 th percentile for age and sex 2. CAD RADS 3 possibly obstructive disease in the mid LAD wit 50% calcified ostial LAD disease Study sent for FFR CT 3.   Normal aortic root 3.3 cm Jenkins Rouge Electronically Signed   By: Jenkins Rouge M.D.   On: 01/30/2020 16:43   Result Date: 01/30/2020 EXAM: OVER-READ INTERPRETATION  CT CHEST The following report is an over-read performed by radiologist Dr. Abigail Miyamoto of [facility], [place] on 01/30/2020. This over-read does not include interpretation of cardiac or coronary anatomy or pathology. The coronary CTA interpretation by the cardiologist is attached. COMPARISON:  05/17/2005 chest radiograph. 08/17/2019 chest radiograph from [facility] medicine. FINDINGS: Vascular: Aortic atherosclerosis. No central pulmonary embolism, on this non-dedicated study. Mediastinum/Nodes: No imaged  thoracic adenopathy. Lungs/Pleura: No pleural fluid.  Clear imaged lungs. Upper Abdomen: Hepatic steatosis. Normal imaged portions of the spleen, stomach. Musculoskeletal: Moderate midthoracic spondylosis. IMPRESSION: 1. No acute findings in the imaged extracardiac chest. 2. Hepatic steatosis. 3. Aortic Atherosclerosis (ICD10-I70.0). Electronically Signed: By: Abigail Miyamoto M.D. On: 01/30/2020 16:12   CT CORONARY FRACTIONAL FLOW RESERVE DATA PREP  Result Date: 01/31/2020 CLINICAL DATA:  CAD EXAM: FFR CT TECHNIQUE: The best systolic and diastolic phases of the patients gated cardiac CTA sent to [facility] for hemodynamic analysis FINDINGS: FFR CT positive in mid LAD 0.53 just after take off of D1 Normal in RCA and circumflex IMPRESSION: Positive FFR CT suggesting hemodynamically significant lesion in mid LAD Heart catheterization will be arranged Electronically Signed   By: Jenkins Rouge M.D.   On: 01/31/2020 08:44    EKG:  Julsy 2021 primary SR rate 59 normal ECG    ASSESSMENT AND PLAN:   1. Dyspnea :  Normal exam and ECG TTE with normal RV/LV EF no bad valves see below ? Anginal equivalent 2. DM:  Discussed low carb diet.  Target hemoglobin A1c is 6.5 or less.  Continue current medications. Encouraged him to see eye doctor  3. HLD:  F.u primary und able to calculate LdL due to high triglycerides Suspect he should be on statin and vascepa  Will know more once A1c down , ETOH cut back and we have calcium score from cardiac CT 4. CAD:  With positive FFR CT significant LAD disease cath arranged for next week continue ASA/beta blocker SL nitro given   Lab called  Orders written F/U post cath   Signed: Jenkins Rouge 01/31/2020, 11:45 AM

## 2020-02-01 LAB — CBC WITH DIFFERENTIAL/PLATELET
Basophils Absolute: 0.1 10*3/uL (ref 0.0–0.2)
Basos: 1 %
EOS (ABSOLUTE): 0.3 10*3/uL (ref 0.0–0.4)
Eos: 3 %
Hematocrit: 36.4 % — ABNORMAL LOW (ref 37.5–51.0)
Hemoglobin: 12.1 g/dL — ABNORMAL LOW (ref 13.0–17.7)
Immature Grans (Abs): 0 10*3/uL (ref 0.0–0.1)
Immature Granulocytes: 0 %
Lymphocytes Absolute: 2.2 10*3/uL (ref 0.7–3.1)
Lymphs: 21 %
MCH: 28.9 pg (ref 26.6–33.0)
MCHC: 33.2 g/dL (ref 31.5–35.7)
MCV: 87 fL (ref 79–97)
Monocytes Absolute: 0.8 10*3/uL (ref 0.1–0.9)
Monocytes: 8 %
Neutrophils Absolute: 6.8 10*3/uL (ref 1.4–7.0)
Neutrophils: 67 %
Platelets: 171 10*3/uL (ref 150–450)
RBC: 4.18 x10E6/uL (ref 4.14–5.80)
RDW: 12.9 % (ref 11.6–15.4)
WBC: 10.2 10*3/uL (ref 3.4–10.8)

## 2020-02-01 LAB — BASIC METABOLIC PANEL
BUN/Creatinine Ratio: 22 (ref 10–24)
BUN: 19 mg/dL (ref 8–27)
CO2: 23 mmol/L (ref 20–29)
Calcium: 9.4 mg/dL (ref 8.6–10.2)
Chloride: 104 mmol/L (ref 96–106)
Creatinine, Ser: 0.87 mg/dL (ref 0.76–1.27)
GFR calc Af Amer: 105 mL/min/{1.73_m2} (ref >59)
GFR calc non Af Amer: 91 mL/min/{1.73_m2} (ref >59)
Glucose: 142 mg/dL — ABNORMAL HIGH (ref 65–99)
Potassium: 4.6 mmol/L (ref 3.5–5.2)
Sodium: 144 mmol/L (ref 134–144)

## 2020-02-05 ENCOUNTER — Other Ambulatory Visit (HOSPITAL_COMMUNITY)
Admission: RE | Admit: 2020-02-05 | Discharge: 2020-02-05 | Disposition: A | Discharge status: Home / Self Care | Payer: 59 | Source: Ambulatory Visit | Attending: Interventional Cardiology | Admitting: Interventional Cardiology

## 2020-02-05 DIAGNOSIS — Z20822 Contact with and (suspected) exposure to covid-19: Secondary | ICD-10-CM | POA: Diagnosis not present

## 2020-02-05 DIAGNOSIS — Z01812 Encounter for preprocedural laboratory examination: Secondary | ICD-10-CM | POA: Insufficient documentation

## 2020-02-06 LAB — SARS CORONAVIRUS 2 (TAT 6-24 HRS): SARS Coronavirus 2: NEGATIVE

## 2020-02-06 NOTE — Progress Notes (Signed)
Attempted to call patient regarding procedure instructions for tomorrow.  Left voice mail- arrival time is 0530, nothing to eat or drink after midnight,  Need responsible adult to drive you home and stay with you for 1st 24 hours.

## 2020-02-07 ENCOUNTER — Other Ambulatory Visit: Payer: Self-pay

## 2020-02-07 ENCOUNTER — Encounter (HOSPITAL_COMMUNITY)
Admission: RE | Disposition: A | Discharge status: Home / Self Care | Payer: Self-pay | Source: Home / Self Care | Attending: Interventional Cardiology

## 2020-02-07 ENCOUNTER — Ambulatory Visit (HOSPITAL_COMMUNITY)
Admission: RE | Admit: 2020-02-07 | Discharge: 2020-02-07 | Disposition: A | Discharge status: Home / Self Care | Payer: 59 | Attending: Interventional Cardiology | Admitting: Interventional Cardiology

## 2020-02-07 ENCOUNTER — Encounter (HOSPITAL_COMMUNITY): Payer: Self-pay | Admitting: Interventional Cardiology

## 2020-02-07 DIAGNOSIS — I25118 Atherosclerotic heart disease of native coronary artery with other forms of angina pectoris: Secondary | ICD-10-CM | POA: Insufficient documentation

## 2020-02-07 DIAGNOSIS — Z79899 Other long term (current) drug therapy: Secondary | ICD-10-CM | POA: Diagnosis not present

## 2020-02-07 DIAGNOSIS — Z87891 Personal history of nicotine dependence: Secondary | ICD-10-CM | POA: Diagnosis not present

## 2020-02-07 DIAGNOSIS — R0609 Other forms of dyspnea: Secondary | ICD-10-CM | POA: Insufficient documentation

## 2020-02-07 DIAGNOSIS — E119 Type 2 diabetes mellitus without complications: Secondary | ICD-10-CM | POA: Insufficient documentation

## 2020-02-07 DIAGNOSIS — Z7984 Long term (current) use of oral hypoglycemic drugs: Secondary | ICD-10-CM | POA: Diagnosis not present

## 2020-02-07 DIAGNOSIS — E785 Hyperlipidemia, unspecified: Secondary | ICD-10-CM | POA: Insufficient documentation

## 2020-02-07 HISTORY — PX: LEFT HEART CATH AND CORONARY ANGIOGRAPHY: CATH118249

## 2020-02-07 LAB — GLUCOSE, CAPILLARY
Glucose-Capillary: 131 mg/dL — ABNORMAL HIGH (ref 70–99)
Glucose-Capillary: 141 mg/dL — ABNORMAL HIGH (ref 70–99)

## 2020-02-07 SURGERY — LEFT HEART CATH AND CORONARY ANGIOGRAPHY
Anesthesia: LOCAL

## 2020-02-07 MED ORDER — METOPROLOL SUCCINATE ER 25 MG PO TB24: 25.0000 mg | ORAL_TABLET | Freq: Every day | ORAL | 11 refills | Status: DC

## 2020-02-07 MED ORDER — NITROGLYCERIN 0.4 MG SL SUBL: 0.4000 mg | SUBLINGUAL_TABLET | SUBLINGUAL | Status: DC | PRN

## 2020-02-07 MED ORDER — HEPARIN SODIUM (PORCINE) 1000 UNIT/ML IJ SOLN
INTRAMUSCULAR | Status: DC | PRN
  Administered 2020-02-07: 5000 [IU] via INTRAVENOUS

## 2020-02-07 MED ORDER — MIDAZOLAM HCL 2 MG/2ML IJ SOLN
INTRAMUSCULAR | Status: DC | PRN
  Administered 2020-02-07: 0.5 mg via INTRAVENOUS
  Administered 2020-02-07: 1 mg via INTRAVENOUS

## 2020-02-07 MED ORDER — HEPARIN (PORCINE) IN NACL 1000-0.9 UT/500ML-% IV SOLN
INTRAVENOUS | Status: DC | PRN
  Administered 2020-02-07 (×2): 500 mL

## 2020-02-07 MED ORDER — SODIUM CHLORIDE 0.9% FLUSH: 3.0000 mL | INTRAVENOUS | Status: DC | PRN

## 2020-02-07 MED ORDER — VERAPAMIL HCL 2.5 MG/ML IV SOLN
INTRAVENOUS | Status: DC | PRN
  Administered 2020-02-07: 08:00:00 10 mL via INTRA_ARTERIAL

## 2020-02-07 MED ORDER — FENTANYL CITRATE (PF) 100 MCG/2ML IJ SOLN
INTRAMUSCULAR | Status: AC
  Filled 2020-02-07: qty 2

## 2020-02-07 MED ORDER — MIDAZOLAM HCL 2 MG/2ML IJ SOLN
INTRAMUSCULAR | Status: AC
  Filled 2020-02-07: qty 2

## 2020-02-07 MED ORDER — VERAPAMIL HCL 2.5 MG/ML IV SOLN
INTRAVENOUS | Status: AC
  Filled 2020-02-07: qty 2

## 2020-02-07 MED ORDER — LIDOCAINE HCL (PF) 1 % IJ SOLN
INTRAMUSCULAR | Status: DC | PRN
  Administered 2020-02-07: 2 mL

## 2020-02-07 MED ORDER — IOHEXOL 350 MG/ML SOLN
INTRAVENOUS | Status: DC | PRN
  Administered 2020-02-07: 08:00:00 110 mL

## 2020-02-07 MED ORDER — LIDOCAINE HCL (PF) 1 % IJ SOLN
INTRAMUSCULAR | Status: AC
  Filled 2020-02-07: qty 30

## 2020-02-07 MED ORDER — FENTANYL CITRATE (PF) 100 MCG/2ML IJ SOLN
INTRAMUSCULAR | Status: DC | PRN
  Administered 2020-02-07: 25 ug via INTRAVENOUS
  Administered 2020-02-07: 50 ug via INTRAVENOUS

## 2020-02-07 MED ORDER — SODIUM CHLORIDE 0.9 % IV SOLN: INTRAVENOUS | Status: DC

## 2020-02-07 MED ORDER — SODIUM CHLORIDE 0.9 % WEIGHT BASED INFUSION
3.0000 mL/kg/h | INTRAVENOUS | Status: AC
  Administered 2020-02-07: 06:00:00 3 mL/kg/h via INTRAVENOUS

## 2020-02-07 MED ORDER — ASPIRIN 81 MG PO CHEW: 81.0000 mg | CHEWABLE_TABLET | ORAL | Status: DC

## 2020-02-07 MED ORDER — SODIUM CHLORIDE 0.9 % IV SOLN: 250.0000 mL | INTRAVENOUS | Status: DC | PRN

## 2020-02-07 MED ORDER — ROSUVASTATIN CALCIUM 20 MG PO TABS: 20.0000 mg | ORAL_TABLET | Freq: Every day | ORAL | 11 refills | Status: DC

## 2020-02-07 MED ORDER — NITROGLYCERIN 1 MG/10 ML FOR IR/CATH LAB
INTRA_ARTERIAL | Status: AC
  Filled 2020-02-07: qty 10

## 2020-02-07 MED ORDER — NITROGLYCERIN 1 MG/10 ML FOR IR/CATH LAB
INTRA_ARTERIAL | Status: DC | PRN
  Administered 2020-02-07: 200 ug

## 2020-02-07 MED ORDER — SODIUM CHLORIDE 0.9 % WEIGHT BASED INFUSION
1.0000 mL/kg/h | INTRAVENOUS | Status: DC
  Administered 2020-02-07: 07:00:00 1 mL/kg/h via INTRAVENOUS

## 2020-02-07 MED ORDER — HEPARIN (PORCINE) IN NACL 1000-0.9 UT/500ML-% IV SOLN
INTRAVENOUS | Status: AC
  Filled 2020-02-07: qty 1000

## 2020-02-07 MED ORDER — ASPIRIN EC 81 MG PO TBEC: 81.0000 mg | DELAYED_RELEASE_TABLET | Freq: Every day | ORAL | 2 refills | Status: AC

## 2020-02-07 MED ORDER — HEPARIN SODIUM (PORCINE) 1000 UNIT/ML IJ SOLN
INTRAMUSCULAR | Status: AC
  Filled 2020-02-07: qty 1

## 2020-02-07 SURGICAL SUPPLY — 11 items
CATH INFINITI 5 FR JL3.5 (CATHETERS) ×1 IMPLANT
CATH INFINITI JR4 5F (CATHETERS) ×1 IMPLANT
DEVICE RAD COMP TR BAND LRG (VASCULAR PRODUCTS) ×1 IMPLANT
GLIDESHEATH SLEND A-KIT 6F 22G (SHEATH) ×1 IMPLANT
GUIDEWIRE INQWIRE 1.5J.035X260 (WIRE) IMPLANT
INQWIRE 1.5J .035X260CM (WIRE) ×2
KIT HEART LEFT (KITS) ×2 IMPLANT
PACK CARDIAC CATHETERIZATION (CUSTOM PROCEDURE TRAY) ×2 IMPLANT
SHEATH PROBE COVER 6X72 (BAG) ×1 IMPLANT
TRANSDUCER W/STOPCOCK (MISCELLANEOUS) ×2 IMPLANT
TUBING CIL FLEX 10 FLL-RA (TUBING) ×2 IMPLANT

## 2020-02-07 NOTE — Interval H&P Note (Signed)
Cath Lab Visit (complete for each Cath Lab visit)  Clinical Evaluation Leading to the Procedure:   ACS: No.  Non-ACS:    Anginal Classification: CCS II  Anti-ischemic medical therapy: Minimal Therapy (1 class of medications)  Non-Invasive Test Results: Intermediate-risk stress test findings: cardiac mortality 1-3%/year  Prior CABG: No previous CABG      History and Physical Interval Note:  02/07/2020 7:35 AM  Ryan Hill  has presented today for surgery, with the diagnosis of chest pain.  The various methods of treatment have been discussed with the patient and family. After consideration of risks, benefits and other options for treatment, the patient has consented to  Procedure(s): LEFT HEART CATH AND CORONARY ANGIOGRAPHY (N/A) as a surgical intervention.  The patient's history has been reviewed, patient examined, no change in status, stable for surgery.  I have reviewed the patient's chart and labs.  Questions were answered to the patient's satisfaction.     Lyn Records III

## 2020-02-07 NOTE — CV Procedure (Signed)
   85% mid LAD bifurcation stenosis with a large septal perforator.  Ostial LAD heavily calcified with 30 to 40% narrowing.  Right dominant coronary anatomy.  First obtuse marginal 40%.  Normal LV function.  LVEF 60%.

## 2020-02-07 NOTE — Research (Addendum)
[facility]  Informed Consent   SCREENFAIL  Subject Name: Ryan Hill    Subject: [id number]  Subject met inclusion and exclusion criteria.  The informed consent form, study requirements and expectations were reviewed with the subject and questions and concerns were addressed prior to the signing of the consent form.  The subject verbalized understanding of the trial requirements.  The subject agreed to participate in the [facility] trial and signed the informed consent at 0700 on 02/07/2020.  The informed consent was obtained prior to performance of any protocol-specific procedures for the subject.  A copy of the signed informed consent was given to the subject and a copy was placed in the subject's medical record.   Boden Stucky  Inclusion: _0  1. Age >18 _1  2. Clinically stable patient with known CAD. _2   3. CCTA showing stenosis in at least one major epicardial vessel of stentable/graftable diameter, in whom clinically indicated IVUS is planned within 45 days of the CCTA  and FFR CT available. _3  4. FFR CT successfully processed. _4  5. Willing to comply with protocol. _5  6. Agrees to be included in the study and able to provide written informed consent.  Exclusion: _6  1. CCTA showing no stenosis. _7  2. Uninterpretable CCTA by HeartFlow assessment, in which image quality prevents FFR CT from being processed. _8  3. Acute chest pain. _9  4. CABG prior to CCTA acquisition. _10  5. Prior history of PCI for 3 or more vessels. _11  6. MI less than 30 days prior to CCTA or between CCTA and ICA. _12  7. Suspicion of acute coronary syndrome, Acute MI or Unstable Angina. _13  8. Known complex congenital heart disease. _14  9. Tachycardia or significant arrythmia. _15  10. Subject requires an emergent procedure. _16  11. Evidence of ongoing or active clinical instability, including acute chest pain  (sudden onset), cardiogenic shock, unstable blood pressure with systolic blood pressure <37 mmHg, and  severe congestive heart failure (NYHA lll or lV) or acute  pulmonary edema.  _17  12. Any active, serious, life-threatening disease with a life expectancy of less than 2 months. _18  13. Currently enrolled in another study utilizing FFR CT or in an investigational trial that involves a non-approved cardiac drug or device.  _19  14. Persons under the protection of justice, guardianship, or curatorship.  Reason for CCTA scan: _20  Chest Pain _21  Asymptomatic/screening     _22  Prior MI     _23  Dyspnea   _24  Palpitations _25  Abnormal ECG    _26    Abnormal Stress or Perfusion   _27  Other Which CT Scanner was used?          _28  Philips Model: _29  CT 5000 Ingenuity    _30  Spectral CT 7500   _31  CT6000 iCT    _32  IQon         _33   Incisive CT    _34   Other _35  Siemens Model: _36  Somatom Definition Edge    _37  Somatom Definition Flash            _38  Somatom Force  _39  Somatom Drive   <CHYIFOYDXAJOINOM>_7<\/EHMCNOBSJGGEZMOQ>_94   Other _41  Product/process development scientist:  _42  Revolution CT  _43  CardioGraph   _44  Optima CT660  _45   Discovery HD 750   _46  Other Radiation Exposure for CCTA Only (Sum for All Coronary Series)  CT dose index (CTDI) (mGy)  ________66.31_____________  Dose-length product (DLP) (mGy-cm) _____875.3_________   _47   CCTA Image Uploaded   _48  CCTA report uploaded  _49  All subject information redacted from image and report?  Subject Demographics: Age: __64_____    Year of birth:  __1957____ Birth Sex: _0  Male   _1   Male  Weight: __88.5__ _2 lb  _3  kg   _4  Not done Height: ___70__ _5  in  _6  cm   _7  Not done  Ethnicity: _8  Not of Hispanic, Latino/a, or Spanish origin _9  Hispanic, Latino/a, or Spanish origin Specify: _10   [place] American, Chicano/a      _11  [place] Rican  _12  [place] _13 other Hispanic, Latino/a, or Spanish origin Race:  _14  White  _15  Black  _16  American [place] or [place] Native  _17  Asian:  Specify: _18  Asian [place]   _19  [place]  _20  [place]  _21  [place]   _22  Filipino  _23  Other Asian  _24  [place]   _25  Native Hawaiian or other  Pacific Islander              _26  Native Hawaiian   _27  Guamanian or Chamorro  _28  Samoan   _29  Other Pacific Islander   _30  Other (specify) Medical History: Angina within the last 45 days:   _31  Yes   _32  No If yes: _33  Typical _34  Atypical  _35  Dyspnea  _36  Noncardiac pain Angina type: _37  Stable _38  Unstable _39  Silent Ischemia  _40  Unknown _41  Other Congestive Heart Failure: _42  Yes _43  No Diabetes: _44  Yes  _45  No If yes:  _46  Type l   _47  Type ll Controlled by:  _48  Insulin   _49  Oral hypoglycemic  _50  Diet  _51   unknown Hypertension:  _52  Yes  <OIZTIWPYKDXIPJAS>_5<\/KNLZJQBHALPFXTKW>_40   No  (Systolic >973, diastolic >53 or req. medication) Is subject taking anti-hypertensive medication?   _54  Yes  _55   No Hyperlipidemia:   _56  Yes  _57  No Is subject taking anti-hyperlipidemic medication?    _58  Yes  _59  No Tobacco Use:  _60  Former  _61  Current  _62   Non-smoker  _63   Unknown Any nicotine use in 24 hours prior to CCTA?   _64  Yes  _65   No Family history of premature atherosclerotic disease?   _66  Yes  _67   No (Coronary artery disease, cerebrovascular disease, or peripheral vascular disease for male 2o relatives < 58 and/or male 2o relatives < 9 years old) Stroke:  _68  Yes  _69  No Transient Ischemic Attack (TIA):   _70  Yes  _71  No Peripheral Vascular Disease:  _72  Yes  _73  No Sedentary Lifestyle:    _74  Yes   _75  No Other Relevant Disease or Comorbidity:    _76  Yes  _77  No Specify: ___________________   PCI History: Does the subject have previously stented vessels?   _78  Yes  _79  No ______________ Total number of PCI procedures ______________ Date of most recent PCI ______________ Total number of stented vessels NOTE: CASS Segment Diagram is provided for reference in Appendix A. PCI History Records: (complete a record for each stent) Stent 1:           Vessel: _80  Right coronary artery  _81  Left main artery  _82  Left circumflex artery _83  Left anterior descending artery   _84  Other________________ CASS Segment for starting location of stent:  ______________________________ CASS Segment for ending location of stent: _______________________________ Stent manufacturer: _________________________________________________ Diameter of stent (mm): _____________   Length of stent (mm): _____________ Stent 2:             Vessel: _85  Right coronary artery  _86  Left main artery  _87  Left circumflex artery _88  Left anterior descending artery   _89  Other________________ CASS Segment for starting location of stent:  ______________________________ CASS Segment for ending location of stent: _______________________________ Stent manufacturer: _________________________________________________ Diameter of stent (mm): _____________   Length of stent (mm): _____________ Coronary Tests: Any coronary tests within 90 days prior to enrollment?  _0  Yes  _1  No Invasive Coronary Angiography (ICA):    _2  Yes _3  No Most recent test date: _________________   Result: _4  Positive  _5  Negative  _6  Intermediate  _7  Unknown _8  Image uploaded       Report: _9  Uploaded  _10  N/A _11  All subject information redacted from images and reports? Exercise Tolerance Test (ETT):  _12 Yes  _13  No     Most recent test date: Click or tap to enter a date. Result:   _14  Positive    _15  Negative  _16  Intermediate  _17   Unknown _18  Image uploaded    Report:  _19  Uploaded  _20  N/A  _21   All subject information redacted from images and reports? Stress Echocardiogram: _22  Yes  _23  No   Most recent date:Click or tap to enter a date. Result:  _24  Positive  _25  Negative  _26  Intermediate  _27  Unknown _28 Image uploaded    Report:  _29  Uploaded  _30  N/A      _31  All images redacted? Nuclear Myocardial Perfusion Scan:    _32  Yes  _33  No Most recent test date: Click or tap to enter a date.  Result:   _34  Positive    _35  Negative  _36  Intermediate  _37   Unknown _38 Image uploaded    Report:  _39  Uploaded  _40  N/A      _41  All images redacted? Cardiac MRI:   _42  Yes  _43  No Most recent test date: Click or tap to enter a  date.  Result:   _44  Positive    _45  Negative  _46  Intermediate  _47   Unknown _48 Image uploaded    Report:  _49  Uploaded  _50  N/A      _51  All images redacted? Cardiac PET:    _52  Yes  _53  No Most recent test date: Click or tap to enter a date.  Result:   _54  Positive    _55  Negative  _56  Intermediate  _57   Unknown _58 Image uploaded    Report:  _59  Uploaded  _60  N/A      _61  All images redacted?  Baseline Cath Procedure: Date of procedure: Click or tap to enter a date. Blood pressure prior to procedure:   Heart Rate prior to procedure: Time of arterial access: Time of first lesion treatment: Time last guide catheter removed: _______________ Aortic pressure (mean mmHg): __________________ LVED pressure (mmHg): ________________________ Arterial access site:   _62  Radial  _63  Femoral  _64  Brachial Maximum arterial sheath size (Fr) _______________ Venous access needle size:  _65  18 G  _66  20 G  _67  4 FR  _68  5 FR  _69  6 FR  _70  Other Left ventriculography performed?  _71  Yes  _72  No If yes, when:   _73  Pre-intervention  _74  Post- intervention LVEF (%):_______________ Were there any procedural complications?          _75  Yes  _76  No _77  Dissection from wire  _78  Dissection from catheter  _79  Slow / no flow _80  Allergic reaction to medication  _81  Side branch occlusion  _82  Other  Was the FFRCT model available & used in cath lab during the procedure? _83  Y  _84  N _85  Angio image uploaded  _86  Cath report uploaded  _87  All subject information redacted from images and reports?  Intraprocedural Medications:  Was adenosine administered within 24 hrs of the cath procedure?  _0  Yes  _1  No IV Adenosine start time: _______________ Adenosine dose:  _2  140 ug/kg/min  _3  Other dose  _4  None Was nitroglycerin administered within 24 hrs of the cath procedure? _5  Yes _6  No NTG dose: _____________ NTG Route: _7  IV  _8  IC  _9  Other  Intraprocedural Devices: (total number of each device used during the procedure): _________ Pressure wires & FFR  wires _________ Guidewires   (Do not include wires used before intervention and pressure/FFR wires) _________ Balloon catheters _________ Drug eluting stents _________ Bare metal stents _________ Guide catheters _________ Intravascular ultrasound catheters _________ Intravascular ultrasound OCT catheters _________ Thrombectomy and atherectomy catheters _________ Temporary pacemakers _________ Intra-aortic balloon pumps (IABP) Intraprocedural Radiation exposure: Total fluoroscopy time _________________ (minutes) Absorbed Dose of Radiation _____________   _10  mGy  _11  Gy Dose Area Product: ____________________  _12  Gy*cm2  _13  cGy*cm2  _14  mGy*cm2  Other: ______________ Contrast Use: (check all that apply) _15  Ominipaque  _16  Visipaque  _17   Isovue  _18  Iomeron  _19  Ultravist   _20 Oxilan _21  Hypaque  _22  Isopaque  _23  Hexibrix  _24  OptiRay  _25  Iopamidol  Total amount of contrast used (mL): ______________  IVUS/OCT: Model: _26  Philips Volcano                    Catheter type:    _27  Revolution  _28  Eagle Eye Platinum  _29  Eagle Eye  P     Platinum ST          _30  Refinity ST               _31  [employer]                   Catheter type:  _32  Opticross   _33  Opticross HD  _34  Other                _35  Terumo                    Catheter type:   _36  ViewIT    _37  Intrafocus WR  Ivus Image uploaded:  _38  Yes  _39  No OCT taken?   _40  Yes   _41  No   _42  All subject information redacted from images and reports?  IVUS/OCT Records Was automatic pullback used for IVUS/OCT acquisition?   _43  Yes  _44  No       Pullback speed (mm/sec): _____________________        Length of pullback (mm): _____________________ Vessel 1: Image type:   _45  IVUS   _46  OCT  _47  Right coronary artery  _48  Left main artery  _49  Left circumflex artery _50  Left anterior descending artery   _51  Other________________ CASS Segment for starting location of guidewire: __________________________ CASS Segment for ending location of guidewire:  ________________________ Guide catheter size:  _52 4 FR  _53 5 FR  _54 6 FR  _55 7 FR  _56 8 FR Vessel 2: Image type:   _57  IVUS   _58  OCT  _59  Right coronary artery  _60  Left main artery  _61  Left circumflex artery _62  Left anterior descending artery   _63  Other________________ CASS Segment for starting location of guidewire: __________________________ CASS Segment for ending location of guidewire: ________________________ Guide catheter size:  _64 4 FR  _65 5 FR  _66 6 FR  _67 7 FR  _68 8 FR Vessel 3: Image type:   _69  IVUS   _70  OCT  _71  Right coronary artery  _72  Left  main artery  _0  Left circumflex artery _1  Left anterior descending artery   _2  Other________________ CASS Segment for starting location of guidewire: __________________________ CASS Segment for ending location of guidewire: ________________________ Guide catheter size:  _3 4 FR  _4 5 FR  _5 6 FR  _6 7 FR  _7 8 FR FFR/NHPR Records Measurement taken during cath procedure: _8  FFR  _9  DFR  _10  IFR  _11  RFR   _12 Other Vessel 1: _13  Right coronary artery  _14  Left main artery  _15  Left circumflex artery _16  Left anterior descending artery   _17  Other________________ Contrast used?     _18  Yes   _19  No CASS Segment for starting location of guide wire: __________________________ CASS Segment for ending location of guide wire: ___________________________ Guide catheter size:  _20 4 FR  _21 5 FR  _22 6 FR  _23 7 FR  _24 8 FR FFR Value: _____________ Pd value: ______________ Pa value: ______________ NHPR value: ____________ Vessel 2: _25  Right coronary artery  _26  Left main artery  _27  Left circumflex artery _28  Left anterior descending artery   _29  Other________________ Contrast used?     _30  Yes   _31  No CASS Segment for starting location of guide wire: __________________________ CASS Segment for ending location of guide wire: ___________________________ Guide catheter size:  _32 4 FR  _33 5 FR  _34 6 FR  _35 7 FR  _36 8 FR FFR Value: _____________ Pd value: ______________ Pa value:  ______________ NHPR value: ____________  Vessel 3: _37  Right coronary artery  _38  Left main artery  _39  Left circumflex artery _40  Left anterior descending artery   _41  Other________________ Contrast used?     _42  Yes   _43  No CASS Segment for starting location of guide wire: __________________________ CASS Segment for ending location of guide wire: ___________________________ Guide catheter size:  _44 4 FR  _45 5 FR  _46 6 FR  _47 7 FR  _48 8 FR FFR Value: _____________ Pd value: ______________ Pa value: ______________ NHPR value: ____________ FFR Uploads _49  Angio Image  _50  FFR Report  _51  FFR Tracings  _52  Screenshot of Angio - starting position of FFR guidewire without contrast _53  Screenshot of Angio - starting position of FFR guidewire with contrast  NHPR Uploads _54  Angio Image  _55  NHPR Report  _56  NHPR Tracings  _57  Screenshot of Angio - starting position of NHPR guidewire without contrast _58  Screenshot of Angio - starting position of NHPR guidewire with contrast _59  All subject information redacted from images and reports?  Study completion:  Did subject complete participation in the study?    _60  Yes  _61  No If no, reason:                         _62  Death _63  Withdrew consent  _64  Other  _65  Screen Failure Screen Failure reason:  _66  Ivus not used  _67  Use of balloon prior  _68  Automatic pullback not used

## 2020-02-07 NOTE — Discharge Instructions (Signed)
See Dr. Eden Emms in 2 to 3 weeks for clinical follow-up.  Please make the appointment as soon as possible.  Radial Site Care Drink plenty of fluids for 24 hours Keep Right arm above heart level for 24 hours  Restart Metformin in 48 hrs -  Sunday 02/10/2020   This sheet gives you information about how to care for yourself after your procedure. Your health care provider may also give you more specific instructions. If you have problems or questions, contact your health care provider. What can I expect after the procedure? After the procedure, it is common to have: Bruising and tenderness at the catheter insertion area. Follow these instructions at home: Medicines Take over-the-counter and prescription medicines only as told by your health care provider. Insertion site care Follow instructions from your health care provider about how to take care of your insertion site. Make sure you: Wash your hands with soap and water before you change your bandage (dressing). If soap and water are not available, use hand sanitizer. Change your dressing as told by your health care provider. Leave stitches (sutures), skin glue, or adhesive strips in place. These skin closures may need to stay in place for 2 weeks or longer. If adhesive strip edges start to loosen and curl up, you may trim the loose edges. Do not remove adhesive strips completely unless your health care provider tells you to do that. Check your insertion site every day for signs of infection. Check for: Redness, swelling, or pain. Fluid or blood. Pus or a bad smell. Warmth. Do not take baths, swim, or use a hot tub until your health care provider approves. You may shower 24-48 hours after the procedure, or as directed by your health care provider. Remove the dressing and gently wash the site with plain soap and water. Pat the area dry with a clean towel. Do not rub the site. That could cause bleeding. Do not apply powder or lotion to the  site. Activity  For 24 hours after the procedure, or as directed by your health care provider: Do not flex or bend the affected arm. Do not push or pull heavy objects with the affected arm. Do not drive yourself home from the hospital or clinic. You may drive 24 hours after the procedure unless your health care provider tells you not to. Do not operate machinery or power tools. Do not lift anything that is heavier than 10 lb (4.5 kg), or the limit that you are told, until your health care provider says that it is safe. Ask your health care provider when it is okay to: Return to work or school. Resume usual physical activities or sports. Resume sexual activity. General instructions If the catheter site starts to bleed, raise your arm and put firm pressure on the site. If the bleeding does not stop, get help right away. This is a medical emergency. If you went home on the same day as your procedure, a responsible adult should be with you for the first 24 hours after you arrive home. Keep all follow-up visits as told by your health care provider. This is important. Contact a health care provider if: You have a fever. You have redness, swelling, or yellow drainage around your insertion site. Get help right away if: You have unusual pain at the radial site. The catheter insertion area swells very fast. The insertion area is bleeding, and the bleeding does not stop when you hold steady pressure on the area. Your arm or hand  becomes pale, cool, tingly, or numb. These symptoms may represent a serious problem that is an emergency. Do not wait to see if the symptoms will go away. Get medical help right away. Call your local emergency services (911 in the [place] Do not drive yourself to the hospital. Summary After the procedure, it is common to have bruising and tenderness at the site. Follow instructions from your health care provider about how to take care of your radial site wound. Check the  wound every day for signs of infection. Do not lift anything that is heavier than 10 lb (4.5 kg), or the limit that you are told, until your health care provider says that it is safe. This information is not intended to replace advice given to you by your health care provider. Make sure you discuss any questions you have with your health care provider. Document Revised: 03/02/2017 Document Reviewed: 03/02/2017 Elsevier Patient Education  2020 [employer].
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
## 2020-02-26 ENCOUNTER — Telehealth: Payer: Self-pay | Admitting: Cardiovascular Disease

## 2020-02-26 NOTE — Telephone Encounter (Signed)
They can come anytime in am and I will work them in

## 2020-02-26 NOTE — Telephone Encounter (Signed)
Wife of patient called. Wife and his patient live in [place] and were hoping to get an appointment earlier in the day to be able to start the drive back home before the weather starts. Wife wanted to know if there was anything the Nurse could do to get him seen sooner. The patient has an appointment with the Dermatologist at 12:15 Friday but wanted to come just after that .  Patient has been put on a wait list.  Please assist
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
## 2020-02-26 NOTE — Telephone Encounter (Signed)
Spoke with the pts wife Eber Jones (on [place]), and informed her that Dr. Eden Emms said they can come in anytime that morning on 1/21, and he will work the pt in.  Informed the pts wife to just inform the registration people of this, and tell them Dr. Shawnie Dapper this.  Will also update this in appt notes so they are aware. Pts wife verbalized understanding and agrees with this plan. Pts wife was very gracious for all the assistance provided, and wanted to send a huge thank you to Dr. Eden Emms for allowing this.

## 2020-02-27 ENCOUNTER — Telehealth: Payer: Self-pay

## 2020-02-27 NOTE — Telephone Encounter (Signed)
Left message for patient to call back  

## 2020-02-27 NOTE — Telephone Encounter (Signed)
-----   Message from Wendall Stade, MD sent at 02/23/2020 12:10 PM EST ----- Patient needs to f/u with Dr Modesto Charon primary for better DM control and to consider starting Jardiance

## 2020-02-27 NOTE — Telephone Encounter (Signed)
Called patient with Dr. Nishan's recommendations. Patient verbalized understanding.  

## 2020-02-29 ENCOUNTER — Other Ambulatory Visit: Payer: Self-pay

## 2020-02-29 ENCOUNTER — Ambulatory Visit (INDEPENDENT_AMBULATORY_CARE_PROVIDER_SITE_OTHER): Payer: 59 | Admitting: Cardiovascular Disease

## 2020-02-29 ENCOUNTER — Encounter: Payer: Self-pay | Admitting: Cardiovascular Disease

## 2020-02-29 VITALS — BP 122/72 | HR 65 | Ht 70.0 in | Wt 192.0 lb

## 2020-02-29 DIAGNOSIS — I2510 Atherosclerotic heart disease of native coronary artery without angina pectoris: Secondary | ICD-10-CM | POA: Diagnosis not present

## 2020-02-29 MED ORDER — ROSUVASTATIN CALCIUM 20 MG PO TABS: 20.0000 mg | ORAL_TABLET | Freq: Every day | ORAL | 3 refills | Status: AC

## 2020-02-29 MED ORDER — NITROGLYCERIN 0.4 MG SL SUBL: 0.4000 mg | SUBLINGUAL_TABLET | SUBLINGUAL | 3 refills | Status: AC | PRN

## 2020-02-29 MED ORDER — METOPROLOL SUCCINATE ER 25 MG PO TB24: 25.0000 mg | ORAL_TABLET | Freq: Every day | ORAL | 3 refills | Status: AC

## 2020-02-29 NOTE — Patient Instructions (Signed)
Medication Instructions:  Your provider recommends that you continue on your current medications as directed. Please refer to the Current Medication list given to you today.   *If you need a refill on your cardiac medications before your next appointment, please call your pharmacy*  Follow-Up: At [facility], you and your health needs are our priority.  As part of our continuing mission to provide you with exceptional heart care, we have created designated Provider Care Teams.  These Care Teams include your primary Cardiologist (physician) and Advanced Practice Providers (APPs -  Physician Assistants and Nurse Practitioners) who all work together to provide you with the care you need, when you need it. Your next appointment:   6 month(s) The format for your next appointment:   In Person Provider:   You may see Dr. Eden Emms or one of the following Advanced Practice Providers on your designated Care Team:    Nada Boozer, NP  Georgie Chard, NP

## 2020-04-14 ENCOUNTER — Ambulatory Visit: Payer: 59 | Admitting: Cardiovascular Disease

## 2020-06-20 ENCOUNTER — Telehealth: Payer: Self-pay

## 2020-06-20 DIAGNOSIS — Z006 Encounter for examination for normal comparison and control in clinical research program: Secondary | ICD-10-CM

## 2020-06-20 NOTE — Telephone Encounter (Signed)
I have attempted without success to contact this patient by phone for his Identify 90 day follow up phone call. I left a message for patient to return my phone call with my name and callback number. An e-mail was also sent to patient.   

## 2020-07-08 ENCOUNTER — Telehealth: Payer: Self-pay

## 2020-07-08 DIAGNOSIS — Z006 Encounter for examination for normal comparison and control in clinical research program: Secondary | ICD-10-CM

## 2020-07-08 NOTE — Telephone Encounter (Signed)
I have attempted without success to contact this patient by phone for his Identify 90 day follow up phone call. I left a message for patient to return my phone call with my name and callback number. An e-mail was also sent to patient.   

## 2020-07-09 ENCOUNTER — Encounter: Payer: Self-pay | Admitting: *Deleted

## 2020-07-09 DIAGNOSIS — Z006 Encounter for examination for normal comparison and control in clinical research program: Secondary | ICD-10-CM

## 2020-07-09 NOTE — Research (Signed)
IDENTIFY 90 DAY PHONE CALL   Patient called back for 90 day phone call. States he is still having the same problems and that he has followed up with this doctor. Will call patient around December to see how he is feeling.    [place] Zaelyn Barbary, Research Assistant   07/09/2020  12:30 p.m.

## 2020-11-01 ENCOUNTER — Ambulatory Visit: Admit: 2020-11-01 | Discharge: 2020-11-01 | Payer: MEDICARE | Attending: Physician Assistant

## 2020-11-01 DIAGNOSIS — J029 Acute pharyngitis, unspecified: Secondary | ICD-10-CM

## 2020-11-01 LAB — AMB POC RAPID STREP TEST: Group A Strep Antigen, POC: NEGATIVE

## 2020-11-01 LAB — POC COVID-19 (LIAT IN HOUSE): SARS-CoV-2: NOT DETECTED

## 2020-11-01 NOTE — Progress Notes (Signed)
Bryan Stanley (DOB:  May 05, 1955) is a 65 y.o. male,here for evaluation of the following chief complaint(s):  Pharyngitis (Pt states he has had a sore throat for two days)          ASSESSMENT/PLAN:  1. Viral pharyngitis  2. Throat pain  -     POC Strep A AG, EIA (Rapid Strep) (80998)  -     COVID-19 ([facility])  Strep and COVID neg in office. No s/s of bacterial infxn on exam. Rec symptomatic treatment with tylenol/motrin prn pain, saline gargles, honey, and OTC mucinex DM or similar prn for cough. Push fluids, rest. RTC or f/u with PCP if symptoms lasting longer than expected 7-10 days.             HPI:  Patient complains of sore throat for 3 days.  Symptoms started suddenly, unchanged.  He endorses some mild nasal congestion/PND and intermittent cough.  He denies fevers, shortness of breath, nausea, vomiting, diarrhea, body aches, chills. Has not tried taking anything for symptoms.      History provided by:  Patient      Review of Systems   Constitutional:  Negative for fatigue and fever.   HENT:  Positive for congestion, postnasal drip and sore throat. Negative for ear pain, sinus pressure and sinus pain.    Eyes:  Negative for redness.   Respiratory:  Negative for cough, chest tightness and shortness of breath.    Cardiovascular:  Negative for chest pain.   Gastrointestinal:  Negative for abdominal pain, diarrhea, nausea and vomiting.   Genitourinary:  Negative for dysuria.       VITALS:  Vitals:    11/01/20 1612   BP: 128/80   Site: Left Upper Arm   Position: Sitting   Cuff Size: Large Adult   Pulse: 66   Resp: 18   Temp: 98.5 ??F (36.9 ??C)   SpO2: 99%   Weight: 194 lb (88 kg)   Height: 5\' 11"  (1.803 m)           MEDICATIONS:  Current Outpatient Medications   Medication Sig Dispense Refill    metoprolol succinate (TOPROL XL) 25 MG extended release tablet TAKE 1 TABLET BY MOUTH EVERY DAY      metFORMIN (GLUCOPHAGE-XR) 500 MG extended release tablet TAKE 2 TABLETS BY MOUTH TWICE DAILY      nitroGLYCERIN  (NITROSTAT) 0.4 MG SL tablet Place 0.4 mg under the tongue every 5 minutes as needed (as needed)      rosuvastatin (CRESTOR) 20 MG tablet TAKE 1 TABLET BY MOUTH EVERY DAY      aspirin 81 MG EC tablet Take 81 mg by mouth daily      diclofenac sodium (VOLTAREN) 1 % GEL APPLY TOPICALLY TO THE AFFECTED AREA TWICE DAILY AS DIRECTED AS NEEDED       No current facility-administered medications for this visit.               There is no problem list on file for this patient.      History reviewed. No pertinent surgical history.         ALLERGIES:  No Known Allergies         Physical Exam  General: Alert. In no acute distress.  HENT:      Head: Normocephalic and atraumatic.      Right Ear: Tympanic membrane and ear canal normal.      Left Ear: Tympanic membrane and ear canal normal.  Nose: No significant congestion.     Mouth: Mucous membranes are moist. Uvula Midline.     Pharynx: Mild erythema posterior pharynx. No oropharyngeal exudate or tonsillar hypertrophy.  Neck:      No cervical node tenderness or adenopathy.  Eyes:      Conjunctiva/sclera: Conjunctivae w/o injection or edema.  Cardiovascular:      Rate and Rhythm: Normal rate and regular rhythm.   Pulmonary:      General: CTAB, unlabored, no adventicious sounds. .   Skin:     General: Skin is warm and dry, No rash.   Neurological:      General: Alert and oriented. No focal deficit present. Normal gait.          An electronic signature was used to authenticate this note.    --Malcolm Metro, PA

## 2020-11-03 ENCOUNTER — Encounter

## 2020-11-03 ENCOUNTER — Ambulatory Visit
Admit: 2020-11-03 | Discharge: 2020-11-03 | Payer: MEDICARE | Attending: Preventive Medicine/Occupational Environmental Medicine

## 2020-11-03 DIAGNOSIS — J189 Pneumonia, unspecified organism: Secondary | ICD-10-CM

## 2020-11-03 LAB — AMB POC BASIC METABOLIC PANEL
BUN, POC: 15 MG/DL
CO2, POC: 23 mEq/L
Calcium, ionized, POC: 1.13 mmol/L
Chloride, POC: 100 MMOL/L
Creatinine, POC: 0.8 MG/DL
Glucose, POC: 177 MG/DL — ABNORMAL HIGH
Potassium, POC: 4 MMOL/L
Sodium, POC: 138 MMOL/L

## 2020-11-03 LAB — AMB POC URINALYSIS DIP STICK AUTO W/O MICRO
Bilirubin, Urine, POC: NEGATIVE
Glucose, Urine, POC: NEGATIVE
Ketones, Urine, POC: POSITIVE
Leukocyte Esterase, Urine, POC: NEGATIVE
Nitrite, Urine, POC: NEGATIVE
Protein, Urine, POC: 30
Specific Gravity, Urine, POC: >=1.03 (ref 1.001–1.035)
Urobilinogen, POC: 0.2
pH, Urine, POC: 5.5 (ref 4.6–8.0)

## 2020-11-03 LAB — POC COVID-19 & INFLUENZA COMBO (LIAT IN HOUSE)
INFLUENZA A: NOT DETECTED
INFLUENZA B: NOT DETECTED
SARS-CoV-2: NOT DETECTED

## 2020-11-03 MED ORDER — AZITHROMYCIN 250 MG PO TABS
250 MG | ORAL_TABLET | ORAL | 0 refills | Status: AC
Start: 2020-11-03 — End: 2020-11-08

## 2020-11-03 MED ORDER — BENZONATATE 100 MG PO CAPS
100 MG | ORAL_CAPSULE | Freq: Three times a day (TID) | ORAL | 0 refills | Status: AC | PRN
Start: 2020-11-03 — End: 2020-11-13

## 2020-11-03 MED ORDER — ONDANSETRON 4 MG PO TBDP
4 MG | Freq: Once | ORAL | Status: AC
Start: 2020-11-03 — End: 2020-11-03
  Administered 2020-11-03: 20:00:00 8 mg via ORAL

## 2020-11-03 MED ORDER — ONDANSETRON 8 MG PO TBDP
8 MG | ORAL_TABLET | Freq: Three times a day (TID) | ORAL | 0 refills | Status: AC | PRN
Start: 2020-11-03 — End: 2020-11-08

## 2020-11-03 MED ORDER — ACETAMINOPHEN 325 MG PO TABS
325 MG | Freq: Once | ORAL | Status: DC
Start: 2020-11-03 — End: 2020-11-03

## 2020-11-03 MED ORDER — ALBUTEROL SULFATE HFA 108 (90 BASE) MCG/ACT IN AERS
108 (90 Base) MCG/ACT | Freq: Four times a day (QID) | RESPIRATORY_TRACT | 0 refills | Status: AC | PRN
Start: 2020-11-03 — End: ?

## 2020-11-03 MED ORDER — ACETAMINOPHEN 325 MG PO TABS
325 MG | Freq: Once | ORAL | Status: AC
Start: 2020-11-03 — End: 2020-11-03
  Administered 2020-11-03: 20:00:00 975 mg via ORAL

## 2020-11-03 NOTE — Progress Notes (Signed)
Bryan Stanley (DOB:  [birth year]) is a 65 y.o. male,here for evaluation of the following chief complaint(s):  Pharyngitis (Pt was seen 2 days ago and diagnosed with viral pharyngitis, is wife called this morning stating that he is not feeling any better)          ASSESSMENT/PLAN:  1. Pneumonia of left lung due to infectious organism, unspecified part of lung  -     azithromycin (ZITHROMAX) 250 MG tablet; Take 1 tablet by mouth See Admin Instructions for 5 days 500mg  on day 1 followed by 250mg  on days 2 - 5, Disp-6 tablet, R-0Normal  -     albuterol sulfate HFA (PROVENTIL HFA) 108 (90 Base) MCG/ACT inhaler; Inhale 2 puffs into the lungs every 6 hours as needed for Wheezing, Disp-1 each, R-0Normal  -     benzonatate (TESSALON) 100 MG capsule; Take 1 capsule by mouth 3 times daily as needed for Cough, Disp-30 capsule, R-0Normal  2. Sore throat  -     COVID-19 & Influenza Combo ([facility]); Future  3. Nausea  -     ondansetron (ZOFRAN-ODT) disintegrating tablet 8 mg; 8 mg, Oral, ONCE, 1 dose, On Mon 11/03/20 at 1600  -     ondansetron (ZOFRAN-ODT) 8 MG TBDP disintegrating tablet; Take 1 tablet by mouth 3 times daily as needed for Nausea or Vomiting, Disp-20 tablet, R-0Normal  4. Fever, unspecified fever cause  -     acetaminophen (TYLENOL) tablet 975 mg; 975 mg (rounded from 1,000 mg), Oral, ONCE, 1 dose, On Mon 11/03/20 at 11/05/20 dose of acetaminophen is 4000 mg from all sources in 24 hours.  -     AMB POC URINALYSIS DIP STICK AUTO W/O MICRO  -     Culture, Urine; Future  -     POC BASIC METABOLIC PANEL  -     XR CHEST (2 VW); Future        CXR with Left lung pneumonia. UA with trace blood, pt reports history of prostate ca and prostatectomy. Urine culture sent to lab, pt advised to let PCP know about trace blood.  Will start Azithromycin, pt may take Zofran as needed. We disc that if any sx worsen or if he develops CP, pt should go to the ED.  Advised good oral hydration.  F/u with PCP.  Pt and spouse  understand and agree with plan.      HPI:  Pt presents today c/o sore throat, cough, nausea/vomiting, fever/chills x 3 days. He endorses some mild SOB. He does have a fever in the clinic of 101.2.   He was previously tested for Covid-19 2 days ago and tested negative. He is here with his spouse today who states that pt mowed the lawn yesterday and started to feel worse. He reports that he woke up today with nausea and vomiting.           History provided by:  Patient        Review of Systems   Constitutional:  Positive for chills and fever.   Respiratory:  Positive for cough and shortness of breath.    Gastrointestinal:  Positive for nausea and vomiting.   All other systems reviewed and are negative.      VITALS:  Vitals:    11/03/20 1532   BP: 130/70   Pulse: (!) 107   Resp: 18   Temp: (!) 101.2 ??F (38.4 ??C)   TempSrc: Oral   SpO2: 100%   Weight: 200 lb (90.7  kg)   Height: 6' (1.829 m)           MEDICATIONS:  Current Outpatient Medications   Medication Sig Dispense Refill    azithromycin (ZITHROMAX) 250 MG tablet Take 1 tablet by mouth See Admin Instructions for 5 days 500mg  on day 1 followed by 250mg  on days 2 - 5 6 tablet 0    ondansetron (ZOFRAN-ODT) 8 MG TBDP disintegrating tablet Take 1 tablet by mouth 3 times daily as needed for Nausea or Vomiting 20 tablet 0    albuterol sulfate HFA (PROVENTIL HFA) 108 (90 Base) MCG/ACT inhaler Inhale 2 puffs into the lungs every 6 hours as needed for Wheezing 1 each 0    benzonatate (TESSALON) 100 MG capsule Take 1 capsule by mouth 3 times daily as needed for Cough 30 capsule 0    metoprolol succinate (TOPROL XL) 25 MG extended release tablet TAKE 1 TABLET BY MOUTH EVERY DAY      metFORMIN (GLUCOPHAGE-XR) 500 MG extended release tablet TAKE 2 TABLETS BY MOUTH TWICE DAILY      nitroGLYCERIN (NITROSTAT) 0.4 MG SL tablet Place 0.4 mg under the tongue every 5 minutes as needed (as needed)      rosuvastatin (CRESTOR) 20 MG tablet TAKE 1 TABLET BY MOUTH EVERY DAY      diclofenac  sodium (VOLTAREN) 1 % GEL APPLY TOPICALLY TO THE AFFECTED AREA TWICE DAILY AS DIRECTED AS NEEDED      aspirin 81 MG EC tablet Take 81 mg by mouth daily       No current facility-administered medications for this visit.        Administrations This Visit       acetaminophen (TYLENOL) tablet 975 mg       Admin Date  11/03/2020 Action  Given Dose  975 mg Route  Oral Administered By  , MA              ondansetron (ZOFRAN-ODT) disintegrating tablet 8 mg       Admin Date  11/03/2020 Action  Given Dose  8 mg Route  Oral Administered By  Claudean Severance, MA                       Patient Active Problem List   Diagnosis    Coronary artery disease of native artery of native heart with stable angina pectoris (HCC)       History reviewed. No pertinent surgical history.         ALLERGIES:  No Known Allergies         Physical Exam  Vitals and nursing note reviewed.   Constitutional:       General: He is not in acute distress.     Appearance: Normal appearance.   HENT:      Head: Normocephalic.   Eyes:      Conjunctiva/sclera: Conjunctivae normal.      Pupils: Pupils are equal, round, and reactive to light.   Cardiovascular:      Rate and Rhythm: Normal rate and regular rhythm.      Heart sounds: Normal heart sounds. No murmur heard.  Pulmonary:      Effort: Pulmonary effort is normal. No respiratory distress.      Breath sounds: Normal breath sounds. No wheezing.   Musculoskeletal:      Right lower leg: No edema.      Left lower leg: No edema.   Skin:     General: Skin  is warm and dry.   Neurological:      General: No focal deficit present.      Mental Status: He is alert and oriented to person, place, and time.   Psychiatric:         Mood and Affect: Mood normal.         Behavior: Behavior normal.             An electronic signature was used to authenticate this note.    --Hamilton Capri, PA-C

## 2020-11-05 LAB — CULTURE, URINE: FINAL REPORT: NO GROWTH

## 2020-11-19 ENCOUNTER — Other Ambulatory Visit: Payer: Self-pay | Admitting: Family Medicine

## 2020-11-19 ENCOUNTER — Ambulatory Visit
Admission: RE | Admit: 2020-11-19 | Discharge: 2020-11-19 | Disposition: A | Discharge status: Home / Self Care | Payer: Self-pay | Source: Ambulatory Visit | Attending: Family Medicine | Admitting: Family Medicine

## 2020-11-19 DIAGNOSIS — J189 Pneumonia, unspecified organism: Secondary | ICD-10-CM

## 2022-12-03 ENCOUNTER — Other Ambulatory Visit: Payer: Self-pay | Admitting: Family Medicine

## 2022-12-03 DIAGNOSIS — Z87891 Personal history of nicotine dependence: Secondary | ICD-10-CM

## 2023-04-15 IMAGING — CR DG CHEST 2V
2 series · 2 of 2 positions shown · non-contrast
Comparison: 08/17/2019

CLINICAL DATA: Pneumonia

EXAM:
CHEST - 2 VIEW

[w chest pa]
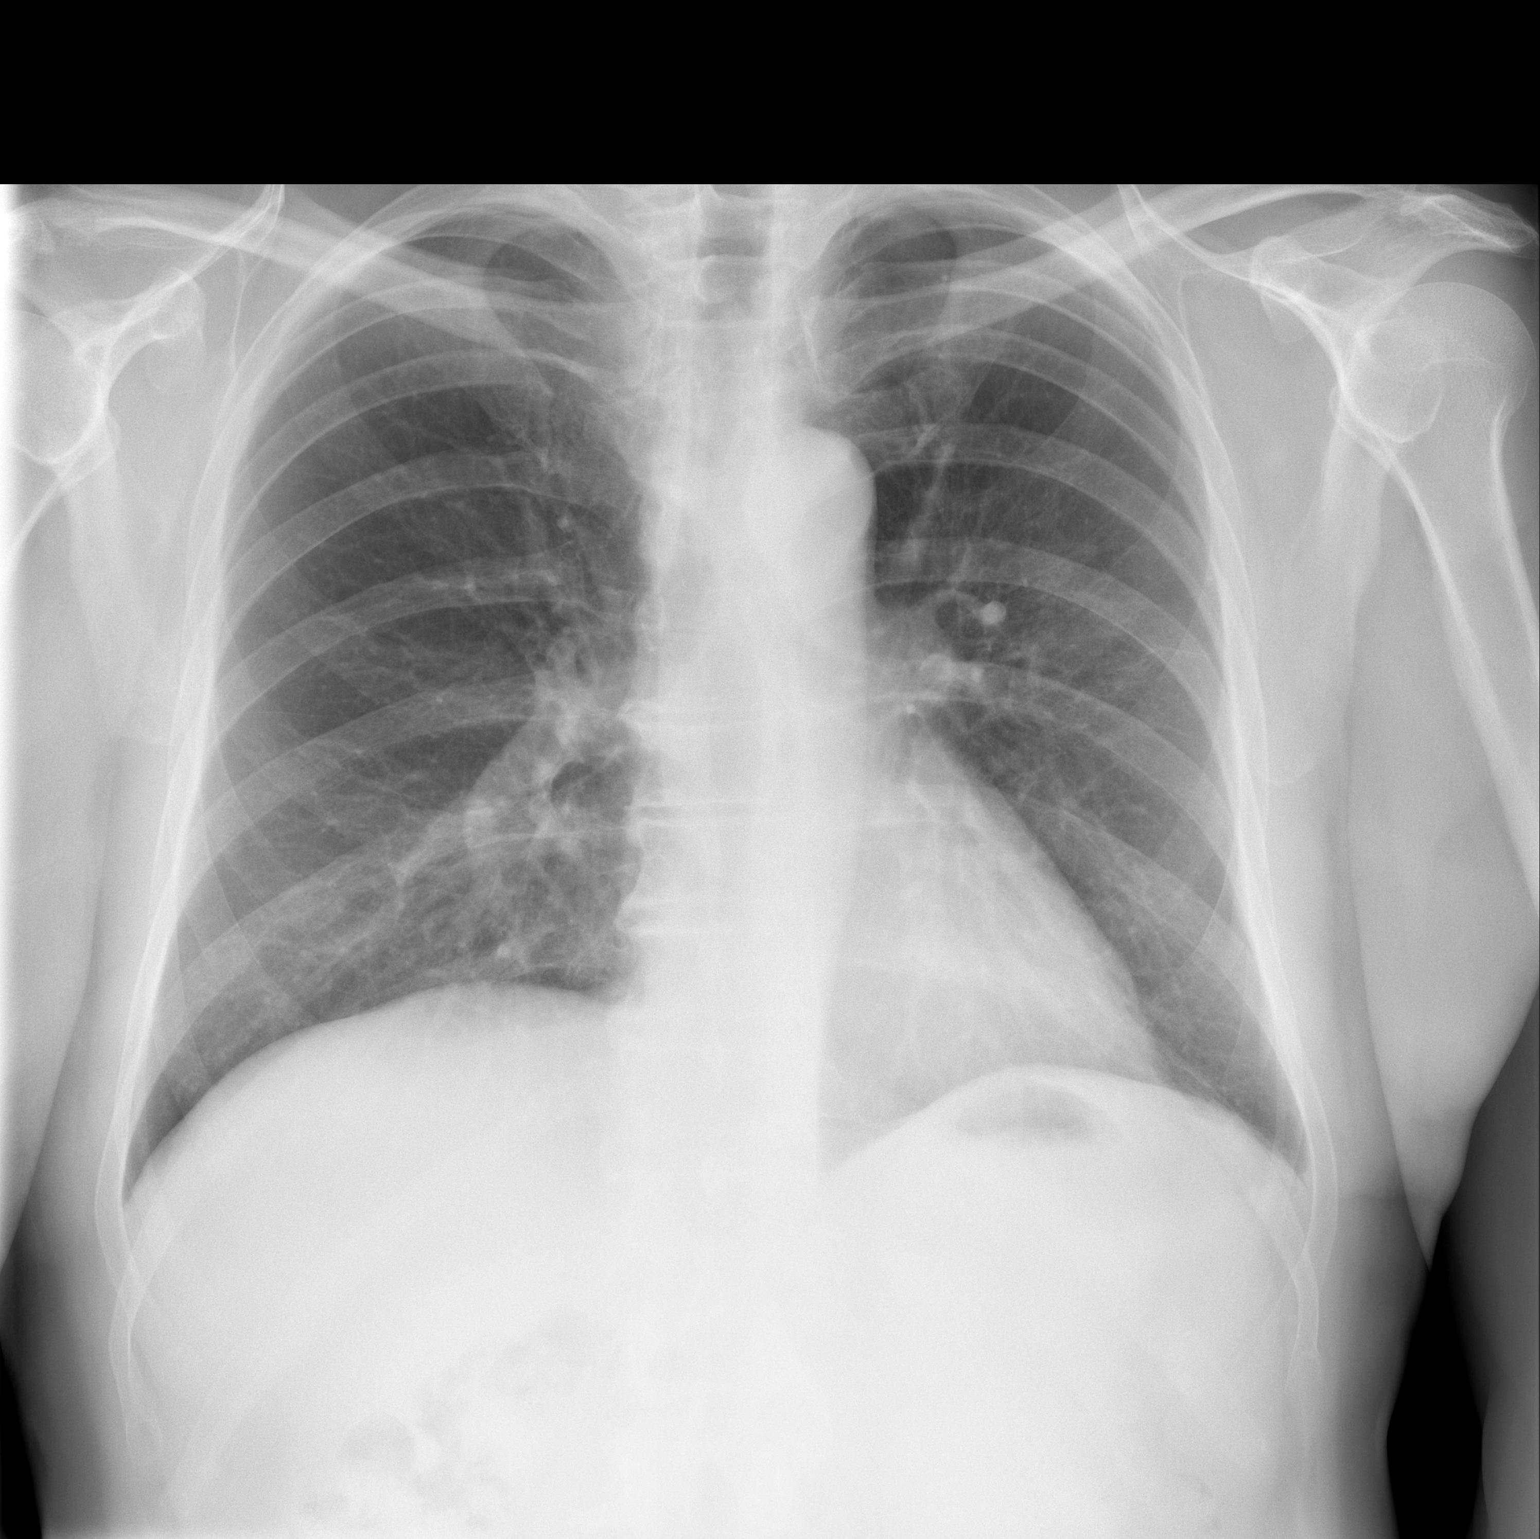

[w chest lat]
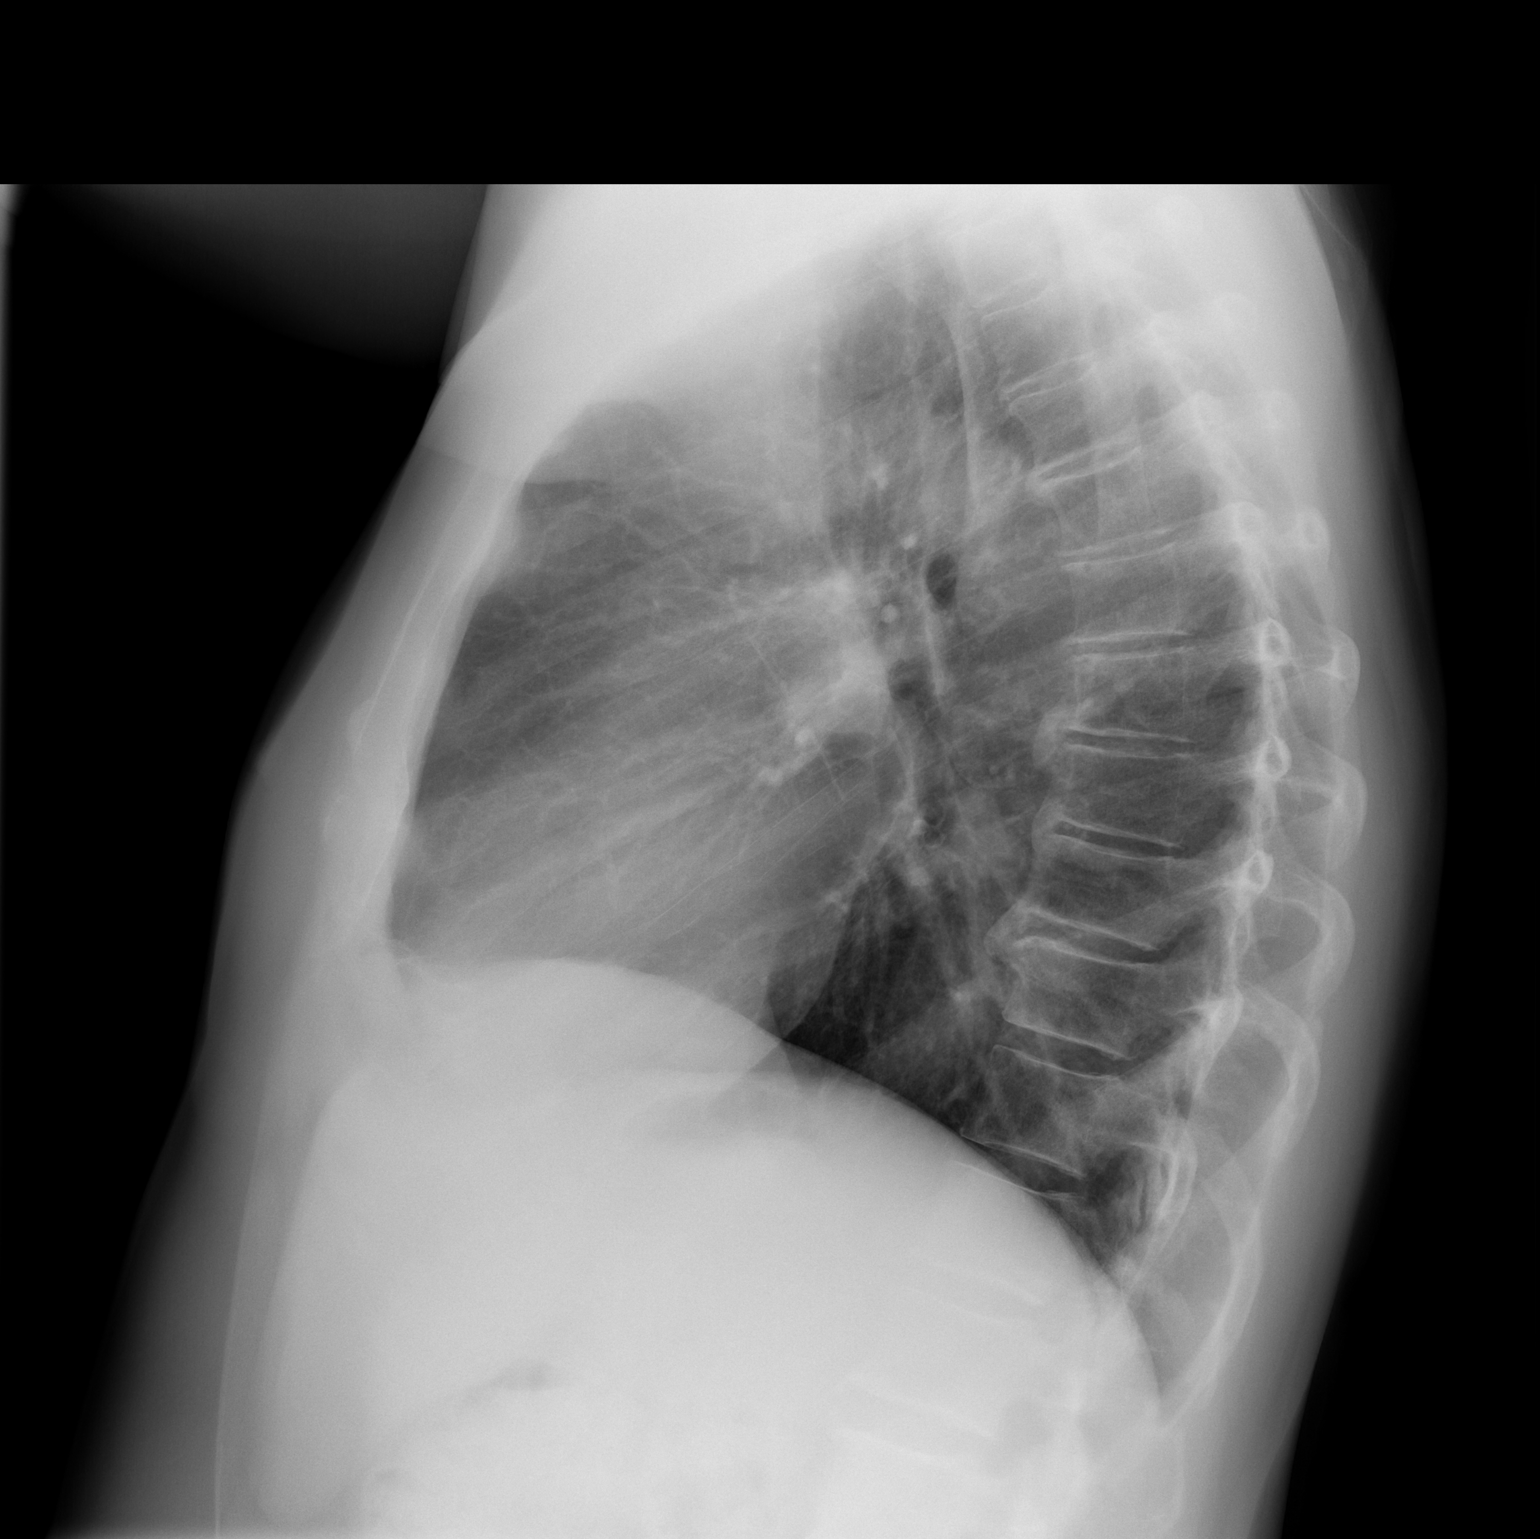

[2 of 2 positions shown; findings below may reference images not displayed]

FINDINGS: The heart size and mediastinal contours are within normal limits. No
focal airspace consolidation, pleural effusion, or pneumothorax. The
visualized skeletal structures are unremarkable.
IMPRESSION: No active cardiopulmonary disease.

## 2023-07-08 ENCOUNTER — Other Ambulatory Visit: Payer: Self-pay | Admitting: Optometry

## 2023-07-08 DIAGNOSIS — H34212 Partial retinal artery occlusion, left eye: Secondary | ICD-10-CM

## 2023-07-11 ENCOUNTER — Ambulatory Visit
Admission: RE | Admit: 2023-07-11 | Discharge: 2023-07-11 | Disposition: A | Discharge status: Home / Self Care | Source: Ambulatory Visit | Attending: Optometry | Admitting: Optometry

## 2023-07-11 ENCOUNTER — Ambulatory Visit
Admission: RE | Admit: 2023-07-11 | Discharge: 2023-07-11 | Disposition: A | Discharge status: Home / Self Care | Source: Ambulatory Visit | Attending: Family Medicine | Admitting: Family Medicine

## 2023-07-11 DIAGNOSIS — H34212 Partial retinal artery occlusion, left eye: Secondary | ICD-10-CM

## 2023-07-11 DIAGNOSIS — Z87891 Personal history of nicotine dependence: Secondary | ICD-10-CM

## 2024-02-06 ENCOUNTER — Ambulatory Visit: Admitting: Cardiovascular Disease

## 2024-04-09 ENCOUNTER — Ambulatory Visit: Admitting: Cardiovascular Disease
